# Patient Record
Sex: Male | Born: 1955
Health system: Southern US, Community
[De-identification: ages and names within clinical notes are randomized; demographics above are authoritative.]

## PROBLEM LIST (undated history)

## (undated) DIAGNOSIS — G4730 Sleep apnea, unspecified: Secondary | ICD-10-CM

## (undated) DIAGNOSIS — M4800 Spinal stenosis, site unspecified: Secondary | ICD-10-CM

## (undated) DIAGNOSIS — R351 Nocturia: Secondary | ICD-10-CM

## (undated) DIAGNOSIS — D472 Monoclonal gammopathy: Secondary | ICD-10-CM

## (undated) HISTORY — DX: Spinal stenosis, site unspecified: M48.00

## (undated) HISTORY — PX: BASAL CELL CARCINOMA EXCISION: SHX1214

## (undated) HISTORY — PX: OTHER SURGICAL HISTORY: SHX169

---

## 1998-06-29 ENCOUNTER — Ambulatory Visit (HOSPITAL_BASED_OUTPATIENT_CLINIC_OR_DEPARTMENT_OTHER): Admission: RE | Admit: 1998-06-29 | Discharge: 1998-06-29 | Payer: Self-pay | Admitting: Orthopedic Surgery

## 2000-09-15 ENCOUNTER — Ambulatory Visit (HOSPITAL_COMMUNITY): Admission: RE | Admit: 2000-09-15 | Discharge: 2000-09-15 | Payer: Self-pay | Admitting: Gastroenterology

## 2008-03-29 ENCOUNTER — Ambulatory Visit: Payer: Self-pay | Admitting: Sports Medicine

## 2008-03-29 DIAGNOSIS — M542 Cervicalgia: Secondary | ICD-10-CM | POA: Insufficient documentation

## 2009-09-07 ENCOUNTER — Ambulatory Visit: Payer: Self-pay | Admitting: Sports Medicine

## 2009-09-07 DIAGNOSIS — M25519 Pain in unspecified shoulder: Secondary | ICD-10-CM | POA: Insufficient documentation

## 2009-09-07 DIAGNOSIS — M67919 Unspecified disorder of synovium and tendon, unspecified shoulder: Secondary | ICD-10-CM | POA: Insufficient documentation

## 2009-09-07 DIAGNOSIS — M719 Bursopathy, unspecified: Secondary | ICD-10-CM

## 2010-05-08 NOTE — Assessment & Plan Note (Signed)
Summary: 4:15-SHOULDER PAIN,MC   Vital Signs:  Patient profile:   55 year old male Height:      76 inches Weight:      200 pounds BMI:     24.43 BP sitting:   112 / 71  Vitals Entered By: Lillia Pauls CMA (September 07, 2009 4:30 PM)  History of Present Illness: played tennis 4 x wk during winter served a lot past 2 mos some difference in strength RT shoulder pain over post shoulder at times p playing hurts to sleep on it but does not awaken at nite  no other pain with ADLs  RT arm is tight wi movement but not painful    Physical Exam  General:  Well-developed,well-nourished,in no acute distress; alert,appropriate and cooperative throughout examination Msk:  Inspection reveals no abnormalities or assymetry; no atrophy noted; palpation is unremarkable;  ROM is full in all planes. specific strength testing of Rotator cuff mm reveals good strength throughout; no signs of impingement; speeds and yergason's tests normal;  no labral pathology noted; norm scapular function observed.  negative painful arc and no drop arm sign.  minimal difference with slt weakness on empty can on RT and on pushoff on RT but not more than mild  Additional Exam:  MSK [place] RT shoulder scan completed norm AC norm BT Supraspinatus is wnl as is subscap and no impingement on movement of either Infraspinatus shows mild increase in fluid in subdeltoid bursa but MM appears nl teres minor nl  images saved   Impression & Recommendations:  Problem # 1:  SHOULDER PAIN, RIGHT (ICD-719.41)  His updated medication list for this problem includes:    Tramadol Hcl 50 Mg Tabs (Tramadol hcl) .Marland Kitchen... 1 by mouth q6h   can use meds only as needed  would start icing for this p playing  Orders: [place] LIMITED ([id number])  Problem # 2:  BURSITIS, SUBDELTOID (ICD-726.10)  given seres of exercises and theraband to use  would do consistent warmup  scan shows no tear so I think he can cont playing while doing rehab  reck  if not resolving in 6 wks  Orders: [place] LIMITED ([id number])  Complete Medication List: 1)  Tramadol Hcl 50 Mg Tabs (Tramadol hcl) .Marland Kitchen.. 1 by mouth q6h

## 2013-12-09 ENCOUNTER — Ambulatory Visit (INDEPENDENT_AMBULATORY_CARE_PROVIDER_SITE_OTHER): Payer: PRIVATE HEALTH INSURANCE | Admitting: Sports Medicine

## 2013-12-09 VITALS — BP 108/70 | Ht 76.0 in | Wt 195.0 lb

## 2013-12-09 DIAGNOSIS — M79609 Pain in unspecified limb: Secondary | ICD-10-CM

## 2013-12-09 DIAGNOSIS — M79644 Pain in right finger(s): Secondary | ICD-10-CM | POA: Insufficient documentation

## 2013-12-09 NOTE — Progress Notes (Signed)
Patient ID: Ryan Fox, male   DOB: 1955/07/27, 58 y.o.   MRN: [id number]  Patient with 1 mo of RT thumb pain at MCP No specific injury sometims sharp and shooting down the phalanx No swelling Became very painful during tennis match last night Usually improves after first several games  Exam NAD BP 108/70  Ht  (1.93 m)  Wt 195 lb (88.451 kg)  BMI 23.75 kg/m2  No swelling or deformity at MCP 1 or CMC on RT Full ROM of thumb No pain over resisted motions No redness Neg finklestein  [place] of thumb and MCP Periarticular cyst on dorsum of MCP 1 RT but not on left Small calcification in medial joint line No effusion Flexor hallucis is normal EPB and ABB are normal CMPTs 1 to 6 are normal sessamoid bone at base of thumb

## 2013-12-09 NOTE — Assessment & Plan Note (Signed)
Given COBAN Try taping the base of thumb for next 3 to 4 weeks See if pain resolves

## 2014-01-27 ENCOUNTER — Ambulatory Visit (INDEPENDENT_AMBULATORY_CARE_PROVIDER_SITE_OTHER): Payer: PRIVATE HEALTH INSURANCE | Admitting: Sports Medicine

## 2014-01-27 ENCOUNTER — Encounter: Payer: Self-pay | Admitting: Sports Medicine

## 2014-01-27 VITALS — BP 108/71

## 2014-01-27 DIAGNOSIS — M25511 Pain in right shoulder: Secondary | ICD-10-CM

## 2014-01-27 DIAGNOSIS — M12811 Other specific arthropathies, not elsewhere classified, right shoulder: Secondary | ICD-10-CM

## 2014-01-27 DIAGNOSIS — M75101 Unspecified rotator cuff tear or rupture of right shoulder, not specified as traumatic: Secondary | ICD-10-CM

## 2014-01-27 MED ORDER — NITROGLYCERIN 0.2 MG/HR TD PT24: 0.2000 mg | MEDICATED_PATCH | Freq: Every day | TRANSDERMAL | Status: DC

## 2014-01-27 NOTE — Assessment & Plan Note (Signed)
Full thickness tear of anterior belly of supraspinatus and partial tear of pec major at insertion into the BT occurs.  - Nitro 1/4" at point of maximal tenderness. Change daily  - given home exercises and stretches  - can continue to play tennis as long as the shots don't aggravate his pain - f/u in one month to ultrasound tear again

## 2014-01-27 NOTE — Progress Notes (Signed)
  Ryan Fox - 58 y.o. male MRN [id number]  Date of birth: 06/24/1955  SUBJECTIVE:     [name] is a 58 yo M presenting with right shoulder pain.   He was playing tennis about 6 weeks ago and noticed a discomfort in his shoulder while serving. He was able to play the rest of the match but the shoulder pain more noticeable. The pain was severe after he got done playing for the match. He quit playing tennis for 4 weeks and pain improved. He tried playing about two weeks ago and noticed the pain when he tried hitting his serve harder. He denies any popping or tearing motion in his shoulder when the pain first occurred. Denies on numbness, tingling and weakness. There is no pain with sleeping on his shoulder. There is no nighttime wakening's. He denies any prior surgery or injury to that shoulder.    ROS:     See HPI   OBJECTIVE: BP 108/71  Physical Exam:  Vital signs are reviewed. General: NAD, well appearing, alert Shoulder: Laterality: right Appearance: symmetric, no erythema, ecchymosis  Tenderness: no   Range of Motion: Passive abduction: normal Flexion:normal IR: normal ER: normal  Active abduction: normal Flexion: normal IR: normal ER: normal  Maneuvers: Empty can: neg Internal rotation: neg External rotation:neg Hawkin's test: neg O'Brien's test: neg Speeds: neg Yergason's: neg Resisted supination mimicked some pain Normal scapular function observed. No painful arc and no drop arm sign. Strength:  Bicep: 5/5 Grip: 5/5  MSK US right shoulder: BT showing a partial tear where insertion of the pec major.  Some hypoechoic changes around the BT suggesting fluid. The anterior belly of supraspinatus with a full thickness tear measuring 1.26 cm. Posterior belly of supraspinatus intact. Subscapularis, infraspinatus and teres minor are all intact. AC joint showing no effusion.   ASSESSMENT & PLAN:  See problem based charting & AVS for pt instructions.

## 2014-02-01 DIAGNOSIS — M12811 Other specific arthropathies, not elsewhere classified, right shoulder: Secondary | ICD-10-CM | POA: Insufficient documentation

## 2014-02-01 DIAGNOSIS — M75101 Unspecified rotator cuff tear or rupture of right shoulder, not specified as traumatic: Secondary | ICD-10-CM | POA: Insufficient documentation

## 2014-02-01 NOTE — Assessment & Plan Note (Signed)
Unfortunately patient developed HA within 2 days of NTG that were too severe to function  Use Aleve 2 bid x 2 weeks  Start some arnica gel for topical relief

## 2014-03-15 ENCOUNTER — Ambulatory Visit (INDEPENDENT_AMBULATORY_CARE_PROVIDER_SITE_OTHER): Payer: PRIVATE HEALTH INSURANCE | Admitting: Sports Medicine

## 2014-03-15 ENCOUNTER — Encounter: Payer: Self-pay | Admitting: Sports Medicine

## 2014-03-15 VITALS — BP 105/71 | Ht 76.0 in | Wt 195.0 lb

## 2014-03-15 DIAGNOSIS — M75101 Unspecified rotator cuff tear or rupture of right shoulder, not specified as traumatic: Secondary | ICD-10-CM

## 2014-03-15 DIAGNOSIS — M12811 Other specific arthropathies, not elsewhere classified, right shoulder: Secondary | ICD-10-CM

## 2014-03-15 NOTE — Progress Notes (Signed)
  Ryan ReaperJames Fox - 58 y.o. male MRN [id number]  Date of birth: [birth year]  SUBJECTIVE:     [name] is a 58 yo M presenting with right shoulder pain.   The patient's previous office visit he was diagnosed with rotator cuff tendinitis particularly with a small partial tear to the supraspinatus seen on ultrasound. Patient has been treating his shoulder pain with nitroglycerin, home exercise program, and some relative rest with less aggressive hits and tenderness. Patient did not tolerate the nitroglycerin after the first week and discontinued this medication. However despite this change in treatment patient has received some clinical improvement from the home exercise program as well as decreasing aggressive tennis playing and frequency over the last 6 weeks. Patient reports he is clinically improved with no longer having pain. He reports normal range of motion and normal strength with no problems during tennis activity.  ROS:     See HPI, review of systems otherwise negative  OBJECTIVE: BP 105/71 mmHg  Ht 6\' 4"  (1.93 m)  Wt 195 lb (88.451 kg)  BMI 23.75 kg/m2  Physical Exam:  Vital signs are reviewed. General: NAD, well appearing, alert Shoulder: Laterality: right Appearance: symmetric, no erythema, ecchymosis  Tenderness: no   Range of motion between 0 and 180 of forward flexion and abduction. Symmetric back scratch at approximately T7 Range of Motion: Passive abduction: normal Flexion:normal IR: normal ER: normal  Active abduction: normal Flexion: normal IR: normal ER: normal  Maneuvers: Empty can: neg Internal rotation: neg External rotation:neg Hawkin's test: neg O'Brien's test: neg Speeds: neg Yergason's: neg Resisted supination mimicked some pain Normal scapular function observed. No painful arc and no drop arm sign. Strength:   Forward flexion 5/5  Scapular flexion 5/5  Internal and external rotation 5/5 Bicep: 5/5 Grip: 5/5  MSK US right shoulder:Repeat muster skeletal  ultrasound revealed resolution of partial tear to the biceps tendon insertion. Mild hypoechoic changes of around the bicipital groove. Gradual healing and improvement of the tear to the supraspinatus appears to be more partial-thickness and full-thickness. Posterior part of the supraspinatus intact. Subscapularis infraspinatus and teres minor are all intact. Normal AC joint.  ASSESSMENT & PLAN: See problem based charting & AVS for pt instructions.

## 2014-03-15 NOTE — Assessment & Plan Note (Signed)
Patient's history, exam, clinical improvement are consistent with resolution of patient's rotator cuff tendinopathy seen on ultrasound and on previous exam. Patient had a good clinical response from therapeutic exercise.  Recommendations: Advised patient to continue home exercise activities at least 3 days a week to maintain strength Gradually return to more aggressive tennis activities avoiding overhead aggressive hits Void pushups and do an alternative triceps push up to protect rotator cuff muscles Follow-up in 6-8 weeks to re-ultrasound shoulder confirm continued healing

## 2015-01-06 ENCOUNTER — Ambulatory Visit (INDEPENDENT_AMBULATORY_CARE_PROVIDER_SITE_OTHER): Payer: PRIVATE HEALTH INSURANCE | Admitting: Sports Medicine

## 2015-01-06 ENCOUNTER — Encounter: Payer: Self-pay | Admitting: Sports Medicine

## 2015-01-06 VITALS — BP 112/70 | Ht 76.0 in | Wt 195.0 lb

## 2015-01-06 DIAGNOSIS — M7701 Medial epicondylitis, right elbow: Secondary | ICD-10-CM | POA: Diagnosis not present

## 2015-01-06 MED ORDER — MELOXICAM 15 MG PO TABS: ORAL_TABLET | ORAL | Status: DC

## 2015-01-06 NOTE — Progress Notes (Signed)
   Subjective:    Patient ID: Ryan Fox, male    DOB: 1955/04/24, 59 y.o.   MRN: [id number]  HPI chief complaint: Right elbow pain  59 year old right-hand-dominant male comes in today complaining of 2 months of medial sided right elbow pain. No trauma that he can recall but a gradual onset of pain that he first noticed while doing pushups. He has since discontinued doing pushups but continues to experience intermittent "stinging" pain along his medial epicondyle particularly with any sort of activity that requires him to push away from his body. He has not noticed any swelling. He denies numbness or tingling. No prior occurrences. Pain does not awaken him at night. 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 No pain with golf either. He has been experiencing some right-sided neck pain which she describes as a "crick and my neck" which began 4 days ago but he feels like it is unrelated to his elbow pain.  Interim medical history reviewed Medications reviewed Allergies reviewed    Review of Systems    as above Objective:   Physical Exam Well-developed, well-nourished. No acute distress. Awake alert and oriented 3. Vital signs reviewed.  Right elbow: Full range of motion. No effusion. No soft tissue swelling. There is slight tenderness to palpation at the medial epicondyle with reproducible pain with resisted wrist flexion and ulnar deviation. No ecchymosis. Good stability. No tenderness over the lateral epicondyle. Negative Tinel's over the to cubital tunnel. Good grip strength. Skin is intact. Good radial and ulnar pulses.  Left elbow: Full range of motion. Good strength. Good stability. No tenderness to palpation. Skin is intact. Neurovascularly intact distally.  MSK ultrasound of the right elbow was performed. Limited images of the medial elbow were obtained. Common flexor tendon is well visualized and does not appear to have any specific tear but there is  an area of hypoechoic change deep to the common flexor tendon which may represent some inflammation in this area.       Assessment & Plan:  Right elbow pain secondary to medial epicondylitis  I will place the patient on meloxicam 15 mg daily for 7 days then as needed. Hopefully this will also help the pain he is getting in his neck. I do not believe that his neck pain and his elbow pain are related. I've given him a home exercise program for medial epicondylitis and instructed him to call me in 3-4 weeks if he is still experiencing pain. If that is the case then I would consider formal physical therapy. I think he can continue with activity using pain as his guide and avoiding those activities that he knows will cause him pain. Of note, he has tried topical nitroglycerin in the past for a shoulder injury but did not tolerate it. Follow-up as needed.

## 2016-06-04 ENCOUNTER — Encounter: Payer: Self-pay | Admitting: Sports Medicine

## 2016-06-04 ENCOUNTER — Ambulatory Visit (INDEPENDENT_AMBULATORY_CARE_PROVIDER_SITE_OTHER): Payer: 59 | Admitting: Sports Medicine

## 2016-06-04 ENCOUNTER — Ambulatory Visit
Admission: RE | Admit: 2016-06-04 | Discharge: 2016-06-04 | Disposition: A | Discharge status: Home / Self Care | Payer: 59 | Source: Ambulatory Visit | Attending: Sports Medicine | Admitting: Sports Medicine

## 2016-06-04 VITALS — BP 95/65 | HR 72 | Ht 75.0 in | Wt 200.0 lb

## 2016-06-04 DIAGNOSIS — M545 Low back pain, unspecified: Secondary | ICD-10-CM

## 2016-06-04 DIAGNOSIS — M48061 Spinal stenosis, lumbar region without neurogenic claudication: Secondary | ICD-10-CM | POA: Insufficient documentation

## 2016-06-04 HISTORY — DX: Spinal stenosis, lumbar region without neurogenic claudication: M48.061

## 2016-06-04 MED ORDER — CYCLOBENZAPRINE HCL 10 MG PO TABS: 10.0000 mg | ORAL_TABLET | Freq: Every evening | ORAL | 1 refills | Status: DC | PRN

## 2016-06-04 NOTE — Assessment & Plan Note (Signed)
In the absence of any abnormalities on x-ray I would recommend that we continue conservative care He needs to do postural exercise and pelvic tilt to try to improve the alignment of the lumbar spine  Considering he has had problems off and on for years he may want to try yoga or Pilates  Flexeril 10 mg at night for muscle relaxant Flexion exercises Ice or heat as needed

## 2016-06-04 NOTE — Progress Notes (Signed)
Low back pain  Mid 1980s LBP to SI joint/ playing VB Took a year to resolve  Periodically has LBP that resolves in 1 to 2 days  Walking and standing worse  5 days ago on airplane He felt sharp pain in his back Difficult to bend over He has rested since then and the pain has improved somewhat Ibuprofen helps  Social history Works as an Associate Professorattorney Nonsmoker  Review of systems No sciatic No bowel or bladder symptoms No weakness in his lower extremities  Physical exam Thin white male in no acute distress BP 95/65   Pulse 72   Ht 6\' 3"  (1.905 m)   Wt 200 lb (90.7 kg)   BMI 25.00 kg/m   Inspection of the lower back reveals increased lordosis No palpable area of tenderness Forward flexion causes pain before 90 Extension causes pain at 25-30 Lateral bending is pain free Rotation is pain free  Heel, toe and tandem walk are normal Reflexes are normal Strength testing of both lower extremities is normal  XRay - reviewed by me and I do not see any acute injury.  There is increased Lordosis particularly at L5/S1

## 2016-06-05 ENCOUNTER — Other Ambulatory Visit: Payer: Self-pay | Admitting: *Deleted

## 2016-06-05 DIAGNOSIS — M545 Low back pain, unspecified: Secondary | ICD-10-CM

## 2017-12-22 ENCOUNTER — Ambulatory Visit (INDEPENDENT_AMBULATORY_CARE_PROVIDER_SITE_OTHER): Payer: 59 | Admitting: Sports Medicine

## 2017-12-22 VITALS — BP 120/78 | Ht 75.5 in | Wt 198.0 lb

## 2017-12-22 DIAGNOSIS — R200 Anesthesia of skin: Secondary | ICD-10-CM | POA: Diagnosis not present

## 2017-12-23 ENCOUNTER — Encounter: Payer: Self-pay | Admitting: Sports Medicine

## 2017-12-23 NOTE — Progress Notes (Signed)
   Subjective:    Patient ID: Ryan ReaperJames Duer, male    DOB: 30-Aug-1955, 62 y.o.   MRN: [id number]  HPI chief complaint: Bilateral foot numbness  Very pleasant 62 year old male comes in today complaining of 4 months of numbness and tingling in all 10 of his toes.  His symptoms do not seem to be related to any specific activity.  He denies numbness or tingling elsewhere in the foot or ankle but does endorse a feeling of "puffiness" on the plantar aspect of both feet, specifically from the midfoot down into the toes.  He denies any back pain currently but does have a history of low back pain which was evaluated by Dr. Darrick PennaFields.  An x-ray of his lumbar spine done previously showed an exaggerated lordosis which was felt to be responsible for his pain.  He improved with a home exercise program and was doing well up until this numbness in his toes began 4 months ago.  He has not noticed anything specifically that makes his symptoms worse but he did buy a pair of well cushioned supportive sandals which do seem to be helping somewhat.  Patient is somewhat concerned about the possibility of lumbar stenosis being responsible for his toe numbness.  Past medical history reviewed Medications reviewed Allergies reviewed    Review of Systems    As above Objective:   Physical Exam  Well-developed, well-nourished.  No acute distress.  Awake alert and oriented x3.  Vital signs reviewed.  Sitting comfortably in the exam room  Examination of both feet in the standing position shows transverse arch collapse bilaterally.  Fairly well preserved longitudinal arch.  No abnormal callus formation.  No soft tissue swelling.  Good pulses.  Sensation appears to be intact to light touch grossly.  Negative Tinel's over the tarsal tunnel.  Good strength.  Walking without a limp.      Assessment & Plan:   Bilateral toe numbness and tingling  Symptoms are definitely neuropathic.  It would be an unusual presentation of spinal  stenosis.  I did discuss the possibility of an MRI of his lumbar spine to evaluate further but we decided to wait on that for now.  He may be getting some peripheral nerve irritation in his foot given the improvement with his recent sandals with arch support.  Patient would like to try a green sports insole with a scaphoid pad in his other shoes to see if this helps alleviate his symptoms.  He does not really have any significant pain so I do not think Neurontin is necessary.  I would like to reevaluate him in 4 weeks.  If symptoms persist then we could reconsider merits of lumbar spine MR.  If his symptoms worsen then I would consider merits of EMG/nerve conduction study.  He may continue with all activity including tennis without restriction and is encouraged to call me with questions or concerns prior to our follow-up visit.

## 2018-01-19 ENCOUNTER — Ambulatory Visit (INDEPENDENT_AMBULATORY_CARE_PROVIDER_SITE_OTHER): Payer: 59 | Admitting: Sports Medicine

## 2018-01-19 VITALS — BP 98/70 | Ht 75.0 in | Wt 195.0 lb

## 2018-01-19 DIAGNOSIS — R200 Anesthesia of skin: Secondary | ICD-10-CM | POA: Diagnosis not present

## 2018-01-20 NOTE — Progress Notes (Signed)
  Ryan Fox comes in today for follow-up.  Unfortunately he is still experiencing numbness in his toes but he likes the arch support in his dress shoes.  He would like additional scaphoid pads for other shoes.  Physical exam was not repeated today.  We simply talked about his ongoing bilateral toe numbness.  He does not endorse any weakness or significant pain.  Although it is possible that his symptoms are originating from lumbar spinal stenosis we have decided to wait on further diagnostic imaging for the time being.  Ryan Fox does understand that if his symptoms worsen or he begins to develop weakness or pain then I would start with getting an MRI of his lumbar spine to rule out spinal stenosis.  If that study were to be unremarkable then I would consider referral to neurology.  In the meantime, we provided him with five additional pairs of scaphoid pads for his dress shoes.  Total time of 15 minutes was spent with the patient with greater than 50% of the time spent in face-to-face consultation discussing the possibility of an MRI of his lumbar spine and fitting his shoes with his new scaphoid pads.  He will follow-up with me as needed.

## 2018-02-16 DIAGNOSIS — Z0000 Encounter for general adult medical examination without abnormal findings: Secondary | ICD-10-CM | POA: Diagnosis not present

## 2018-02-16 DIAGNOSIS — R82998 Other abnormal findings in urine: Secondary | ICD-10-CM | POA: Diagnosis not present

## 2018-02-16 DIAGNOSIS — R7309 Other abnormal glucose: Secondary | ICD-10-CM | POA: Diagnosis not present

## 2018-02-23 DIAGNOSIS — Z23 Encounter for immunization: Secondary | ICD-10-CM | POA: Diagnosis not present

## 2018-02-23 DIAGNOSIS — Z6824 Body mass index (BMI) 24.0-24.9, adult: Secondary | ICD-10-CM | POA: Diagnosis not present

## 2018-02-23 DIAGNOSIS — Z0000 Encounter for general adult medical examination without abnormal findings: Secondary | ICD-10-CM | POA: Diagnosis not present

## 2018-02-23 DIAGNOSIS — F418 Other specified anxiety disorders: Secondary | ICD-10-CM | POA: Diagnosis not present

## 2018-02-23 DIAGNOSIS — J3089 Other allergic rhinitis: Secondary | ICD-10-CM | POA: Diagnosis not present

## 2018-02-23 DIAGNOSIS — R7309 Other abnormal glucose: Secondary | ICD-10-CM | POA: Diagnosis not present

## 2018-02-23 DIAGNOSIS — M79673 Pain in unspecified foot: Secondary | ICD-10-CM | POA: Diagnosis not present

## 2018-02-24 DIAGNOSIS — Z1212 Encounter for screening for malignant neoplasm of rectum: Secondary | ICD-10-CM | POA: Diagnosis not present

## 2018-02-27 DIAGNOSIS — L570 Actinic keratosis: Secondary | ICD-10-CM | POA: Diagnosis not present

## 2018-02-27 DIAGNOSIS — X32XXXD Exposure to sunlight, subsequent encounter: Secondary | ICD-10-CM | POA: Diagnosis not present

## 2018-05-04 DIAGNOSIS — Z6824 Body mass index (BMI) 24.0-24.9, adult: Secondary | ICD-10-CM | POA: Diagnosis not present

## 2018-05-04 DIAGNOSIS — J40 Bronchitis, not specified as acute or chronic: Secondary | ICD-10-CM | POA: Diagnosis not present

## 2018-05-04 DIAGNOSIS — R05 Cough: Secondary | ICD-10-CM | POA: Diagnosis not present

## 2018-05-12 DIAGNOSIS — J40 Bronchitis, not specified as acute or chronic: Secondary | ICD-10-CM | POA: Diagnosis not present

## 2018-05-12 DIAGNOSIS — Z6824 Body mass index (BMI) 24.0-24.9, adult: Secondary | ICD-10-CM | POA: Diagnosis not present

## 2018-10-16 DIAGNOSIS — Z1283 Encounter for screening for malignant neoplasm of skin: Secondary | ICD-10-CM | POA: Diagnosis not present

## 2018-10-16 DIAGNOSIS — L570 Actinic keratosis: Secondary | ICD-10-CM | POA: Diagnosis not present

## 2018-10-16 DIAGNOSIS — L821 Other seborrheic keratosis: Secondary | ICD-10-CM | POA: Diagnosis not present

## 2018-10-16 DIAGNOSIS — X32XXXD Exposure to sunlight, subsequent encounter: Secondary | ICD-10-CM | POA: Diagnosis not present

## 2018-10-16 DIAGNOSIS — D225 Melanocytic nevi of trunk: Secondary | ICD-10-CM | POA: Diagnosis not present

## 2019-02-18 ENCOUNTER — Ambulatory Visit
Admission: RE | Admit: 2019-02-18 | Discharge: 2019-02-18 | Disposition: A | Discharge status: Home / Self Care | Payer: BC Managed Care – PPO | Source: Ambulatory Visit | Attending: Sports Medicine | Admitting: Sports Medicine

## 2019-02-18 ENCOUNTER — Other Ambulatory Visit: Payer: Self-pay

## 2019-02-18 ENCOUNTER — Ambulatory Visit: Payer: BC Managed Care – PPO | Admitting: Sports Medicine

## 2019-02-18 VITALS — BP 110/78 | Ht 75.0 in | Wt 200.0 lb

## 2019-02-18 DIAGNOSIS — M5416 Radiculopathy, lumbar region: Secondary | ICD-10-CM

## 2019-02-18 DIAGNOSIS — R200 Anesthesia of skin: Secondary | ICD-10-CM

## 2019-02-18 DIAGNOSIS — G8929 Other chronic pain: Secondary | ICD-10-CM | POA: Diagnosis not present

## 2019-02-18 DIAGNOSIS — M545 Low back pain: Secondary | ICD-10-CM | POA: Diagnosis not present

## 2019-02-18 NOTE — Assessment & Plan Note (Signed)
Chronic LBP with no specific DX  Repeat XR to rule out any spondy or change in bone/ disc space  Check MRI to see if any reason for distal neuropathy that is bilateral  Cont exercise series

## 2019-02-18 NOTE — Assessment & Plan Note (Signed)
With hsx of LBP will check MRI first  May need more workup to include NCV

## 2019-02-18 NOTE — Progress Notes (Signed)
Patient with CC: LBP and foot numbness  Patient has had several bouts of rather severe LBP XR in 2018 was relatively benign Went to PT with Katrine Coho and this has really helped decrease the flares However, since 2019 has had some progressive numbness in toes of both feet Seen by Dr Micheline Chapman and nothing remarkable on exam Trial of sports insoles with arch support helped but numbness persisted  Describes as funny sensation in toes Shoes start to feel tight Flexing toes may lessen the sxs Occurs at night Can wake him from sleep Tennis does not really make it worse during play but may have more feelings of tightness later  Past Hx NO DM, Thryoid or neurological diseases No radiation exposure or toxic medications No known chemical exposures  ROS Back pain is triggered sometimes by wrong movement - too much flexion or extension No sciatic sxs No weakness in LEs  PE Athletic M in NAD BP 110/78   Ht 6\' 3"  (1.905 m)   Wt 200 lb (90.7 kg)   BMI 25.00 kg/m   Back flexion and extension normal - he is cautious with flexion Lateral bend normal Rotation feels fine with no pain and good motion Heel and toe walk normal  Strength norm SLR neg DTRs 2+ knee and ankle sensory change is mild over toes and can feel light touch Pulses normal  XR reviewed There is some loss of disc space upper lumbar versus films of 2018 No other changes to explain numbness in toes

## 2019-02-19 DIAGNOSIS — R739 Hyperglycemia, unspecified: Secondary | ICD-10-CM | POA: Diagnosis not present

## 2019-02-19 DIAGNOSIS — Z125 Encounter for screening for malignant neoplasm of prostate: Secondary | ICD-10-CM | POA: Diagnosis not present

## 2019-02-19 DIAGNOSIS — Z0000 Encounter for general adult medical examination without abnormal findings: Secondary | ICD-10-CM | POA: Diagnosis not present

## 2019-02-19 DIAGNOSIS — R82998 Other abnormal findings in urine: Secondary | ICD-10-CM | POA: Diagnosis not present

## 2019-02-19 DIAGNOSIS — Z79899 Other long term (current) drug therapy: Secondary | ICD-10-CM | POA: Diagnosis not present

## 2019-02-26 ENCOUNTER — Other Ambulatory Visit: Payer: Self-pay

## 2019-02-26 ENCOUNTER — Ambulatory Visit
Admission: RE | Admit: 2019-02-26 | Discharge: 2019-02-26 | Disposition: A | Discharge status: Home / Self Care | Payer: BC Managed Care – PPO | Source: Ambulatory Visit | Attending: Sports Medicine | Admitting: Sports Medicine

## 2019-02-26 DIAGNOSIS — R209 Unspecified disturbances of skin sensation: Secondary | ICD-10-CM | POA: Diagnosis not present

## 2019-02-26 DIAGNOSIS — R739 Hyperglycemia, unspecified: Secondary | ICD-10-CM | POA: Diagnosis not present

## 2019-02-26 DIAGNOSIS — M545 Low back pain: Secondary | ICD-10-CM | POA: Diagnosis not present

## 2019-02-26 DIAGNOSIS — Z1331 Encounter for screening for depression: Secondary | ICD-10-CM | POA: Diagnosis not present

## 2019-02-26 DIAGNOSIS — Z0000 Encounter for general adult medical examination without abnormal findings: Secondary | ICD-10-CM | POA: Diagnosis not present

## 2019-02-26 DIAGNOSIS — M5416 Radiculopathy, lumbar region: Secondary | ICD-10-CM

## 2019-02-26 DIAGNOSIS — J309 Allergic rhinitis, unspecified: Secondary | ICD-10-CM | POA: Diagnosis not present

## 2019-02-26 DIAGNOSIS — M48061 Spinal stenosis, lumbar region without neurogenic claudication: Secondary | ICD-10-CM | POA: Diagnosis not present

## 2019-03-03 ENCOUNTER — Other Ambulatory Visit: Payer: Self-pay

## 2019-03-03 MED ORDER — GABAPENTIN 300 MG PO CAPS: 300.0000 mg | ORAL_CAPSULE | Freq: Every day | ORAL | 2 refills | Status: DC

## 2019-03-24 DIAGNOSIS — Z1212 Encounter for screening for malignant neoplasm of rectum: Secondary | ICD-10-CM | POA: Diagnosis not present

## 2019-03-31 DIAGNOSIS — H5213 Myopia, bilateral: Secondary | ICD-10-CM | POA: Diagnosis not present

## 2019-03-31 DIAGNOSIS — H43811 Vitreous degeneration, right eye: Secondary | ICD-10-CM | POA: Diagnosis not present

## 2019-05-10 ENCOUNTER — Other Ambulatory Visit: Payer: Self-pay | Admitting: *Deleted

## 2019-05-10 MED ORDER — GABAPENTIN 300 MG PO CAPS: ORAL_CAPSULE | ORAL | 1 refills | Status: DC

## 2019-06-17 ENCOUNTER — Ambulatory Visit: Payer: Self-pay | Attending: Internal Medicine

## 2019-06-17 DIAGNOSIS — Z23 Encounter for immunization: Secondary | ICD-10-CM

## 2019-06-17 NOTE — Progress Notes (Signed)
   Covid-19 Vaccination Clinic  Name:  Praneel Haisley    MRN: [id number] DOB: 02-Aug-1955  06/17/2019  Mr. Kiner was observed post Covid-19 immunization for 15 minutes without incident. He was provided with Vaccine Information Sheet and instruction to access the V-Safe system.   Mr. Gunnels was instructed to call 911 with any severe reactions post vaccine: Marland Kitchen Difficulty breathing  . Swelling of face and throat  . A fast heartbeat  . A bad rash all over body  . Dizziness and weakness   Immunizations Administered    Name Date Dose VIS Date Route   Pfizer COVID-19 Vaccine 06/17/2019  4:07 PM 0.3 mL 03/19/2019 Intramuscular   Manufacturer: [employer], [employer]   Lot: [id number]   NDC: [address]-3

## 2019-07-12 ENCOUNTER — Ambulatory Visit: Payer: Self-pay | Attending: Internal Medicine

## 2019-07-12 DIAGNOSIS — Z23 Encounter for immunization: Secondary | ICD-10-CM

## 2019-07-12 NOTE — Progress Notes (Signed)
   Covid-19 Vaccination Clinic  Name:  Ryan Fox    MRN: [id number] DOB: 01/06/1956  07/12/2019  Mr. Comp was observed post Covid-19 immunization for 15 minutes without incident. He was provided with Vaccine Information Sheet and instruction to access the V-Safe system.   [name] was instructed to call 911 with any severe reactions post vaccine: Marland Kitchen Difficulty breathing  . Swelling of face and throat  . A fast heartbeat  . A bad rash all over body  . Dizziness and weakness   Immunizations Administered    Name Date Dose VIS Date Route   Pfizer COVID-19 Vaccine 07/12/2019 12:03 PM 0.3 mL 03/19/2019 Intramuscular   Manufacturer: [employer], [employer]   Lot: [id number]   NDC: [address]-7

## 2019-07-27 ENCOUNTER — Telehealth: Payer: Self-pay

## 2019-07-27 MED ORDER — GABAPENTIN 300 MG PO CAPS: ORAL_CAPSULE | ORAL | 1 refills | Status: DC

## 2019-07-27 NOTE — Telephone Encounter (Signed)
Refill sent to pharmacy for Gabapentin 900 mg at bedtime.

## 2019-09-29 ENCOUNTER — Other Ambulatory Visit: Payer: Self-pay

## 2019-09-29 MED ORDER — GABAPENTIN 300 MG PO CAPS: ORAL_CAPSULE | ORAL | 1 refills | Status: DC

## 2019-09-29 NOTE — Progress Notes (Signed)
Pt called asking for gabapentin refill.

## 2019-11-25 ENCOUNTER — Other Ambulatory Visit: Payer: Self-pay | Admitting: *Deleted

## 2019-11-25 MED ORDER — GABAPENTIN 300 MG PO CAPS: ORAL_CAPSULE | ORAL | 1 refills | Status: DC

## 2020-01-13 ENCOUNTER — Other Ambulatory Visit: Payer: Self-pay

## 2020-01-13 ENCOUNTER — Ambulatory Visit (INDEPENDENT_AMBULATORY_CARE_PROVIDER_SITE_OTHER): Payer: 59 | Admitting: Sports Medicine

## 2020-01-13 ENCOUNTER — Encounter: Payer: Self-pay | Admitting: Sports Medicine

## 2020-01-13 VITALS — BP 106/78 | Ht 75.0 in | Wt 200.0 lb

## 2020-01-13 DIAGNOSIS — R200 Anesthesia of skin: Secondary | ICD-10-CM

## 2020-01-13 DIAGNOSIS — M5416 Radiculopathy, lumbar region: Secondary | ICD-10-CM | POA: Diagnosis not present

## 2020-01-13 MED ORDER — GABAPENTIN 300 MG PO CAPS: ORAL_CAPSULE | ORAL | 2 refills | Status: DC

## 2020-01-13 NOTE — Patient Instructions (Signed)
Thank you for coming in to see Ryan Fox today! Please see below to review our plan for today's visit:   Please start the below gabapentin titration schedule:  Starting now: 300mg  in AM, 300mg  midday, 900mg  at bedtime In 1 week: 600mg  in AM, 600mg  midday, 900mg  at bedtime In 1 month: 900mg  in AM, 900mg  midday, 900mg  at bedtime  If it anytime you have relief of your symptoms, stop at that dose.  If you develop side effects you cannot tolerate, please stop and stop at that dose and return to your previous dose.  Please follow-up for orthotics, and in 3 months to reevaluate.  Please call the clinic at [phone] if your symptoms worsen or you have any concerns. It was our pleasure to serve you.       Dr. Dr. [facility]

## 2020-01-13 NOTE — Progress Notes (Signed)
PCP: Creola Corn, MD  Subjective:   HPI: Patient is a 64 y.o. male with history of bilateral plantar neuropathy and known spinal stenosis here for reevaluation of bilateral plantar foot numbness and tingling.  He was last seen here on 02/2019, at that time was having intermittent low back pain and numbness and tingling in his toes.  They were worse at night and can wake him from sleep.  It was thought that he may have a lumbar radiculopathy and MRI was obtained which showed mild spinal stenosis at L3-4, moderate spinal stenosis with moderate subarticular stenosis at L4-5 and mild subarticular stenosis at L5-S1.  He was managed conservatively with gabapentin 1200 mg at bedtime.  Since then, patient reports that his symptoms have progressively worsened.  He is now having significant numbness and tingling throughout both feet.  It does not feel painful, rather an aggravating numbness and tingling.  Is aggravated by anything that is making contact with the plantar aspect of his feet.  He has resorted to taking his shoes off while at work.  Gabapentin at night is helpful but does not get rid of the pain.  He is wondering what other options he has.  He denies any history of diabetes, thyroid disorders.  He reports a fairly normal American diet.  Review of Systems:  Per HPI.   PMFSH, medications and smoking status reviewed.      Objective:  Physical Exam:  [facility] 01/13/2020  Frequency of aerobic exercise (# of days/week) 3  Average time in minutes > 90  Frequency of strengthening activities (# of days/week) 3    BP 106/78   Ht 6\' 3"  (1.905 m)   Wt 200 lb (90.7 kg)   BMI 25.00 kg/m   Gen: awake, alert, NAD, comfortable in exam room Pulm: breathing unlabored  Lumbar spine:  Inspection: No evidence of erythema, ecchymosis, swelling edema.  Palpation: No midline spinal tenderness. No paraspinal tenderness of the lumbar spines. Nontender to SI joints.  ROM:  Intact to forward flexion, extension, rotation, and bending.  Special tests: Neg straight leg raise bilaterally  Bilateral feet:  Inspection:  No obvious bony deformity.  No swelling, erythema, or bruising.  Normal arch Palpation: Reproduction of symptoms with palpation of plantar foot. ROM: Full  ROM of the ankle. Normal midfoot flexibility Strength: 5/5 strength ankle in all planes, 5/5 strength with dorsiflexion of great toe. Neuro: Sensation testing with needle prick normal bilaterally on the plantar foot.  Mildly decreased reflexes with achilles reflex, normal patellar and hamstrings reflexes  Note some color change in feet    Assessment & Plan:  1.  Bilateral plantar neuropathy 2.  Lumbar radiculopathy secondary to spinal stenosis  Patient with continued/worsening plantar foot neuropathy.  Continue to suspect spinal stenosis as cause, however cannot rule out alternative such as B12 deficiency, folate deficiency, hypothyroidism, diabetic neuropathy, tarsal tunnel syndrome, among others.  He reports that he has gotten labs to screen for diabetes and hypothyroidism and does not have these.  He is not vegetarian so B12/folate deficiency unlikely.  We will plan to continue to treat as radiculopathy secondary spinal stenosis, increase his gabapentin to 3 times a day and titrate up per the protocol in his AVS.  We will also have him back for custom orthotics as well cushioned shoes are helpful for his symptoms.  If he continues have symptoms with this, would consider referral to neurology for further evaluation.   , MD [facility] 01/13/2020 4:42 PM   I observed and examined the patient with the [facility] resident and agree with assessment and plan.  Note reviewed and modified by me. Sterling Big, MD

## 2020-05-10 ENCOUNTER — Other Ambulatory Visit: Payer: Self-pay

## 2020-05-10 MED ORDER — GABAPENTIN 300 MG PO CAPS: ORAL_CAPSULE | ORAL | 2 refills | Status: DC

## 2020-05-10 NOTE — Progress Notes (Signed)
Patient called asking for a refill on gabapentin.

## 2020-08-22 ENCOUNTER — Other Ambulatory Visit: Payer: Self-pay | Admitting: Sports Medicine

## 2020-08-22 ENCOUNTER — Other Ambulatory Visit: Payer: Self-pay

## 2020-12-19 ENCOUNTER — Telehealth: Payer: Self-pay | Admitting: Sports Medicine

## 2020-12-19 ENCOUNTER — Other Ambulatory Visit: Payer: Self-pay

## 2020-12-19 MED ORDER — GABAPENTIN 300 MG PO CAPS: ORAL_CAPSULE | ORAL | 2 refills | Status: DC

## 2020-12-19 NOTE — Telephone Encounter (Signed)
Pt called to request refill of :  gabapentin (NEURONTIN) 300 MG capsule [[id number]]    Order Details Dose, Route, Frequency: As Directed  Dispense Quantity: 270 capsule Refills: 2        Sig: TAKE 3 CAPSULES(900 MG) BY MOUTH THREE TIMES DAILY   --Pt uses :  Pharmacy  [id number] [place], [place] - [address] AT [facility] [address] & [address] [place] [address]  Phone:  [phone]  Fax:  [phone]   --glh

## 2020-12-20 ENCOUNTER — Encounter (INDEPENDENT_AMBULATORY_CARE_PROVIDER_SITE_OTHER): Payer: Self-pay

## 2020-12-26 ENCOUNTER — Other Ambulatory Visit: Payer: Self-pay

## 2020-12-26 ENCOUNTER — Encounter (INDEPENDENT_AMBULATORY_CARE_PROVIDER_SITE_OTHER): Payer: Self-pay | Admitting: Ophthalmology

## 2020-12-26 ENCOUNTER — Ambulatory Visit (INDEPENDENT_AMBULATORY_CARE_PROVIDER_SITE_OTHER): Payer: 59 | Admitting: Ophthalmology

## 2020-12-26 DIAGNOSIS — H2513 Age-related nuclear cataract, bilateral: Secondary | ICD-10-CM | POA: Insufficient documentation

## 2020-12-26 DIAGNOSIS — H35371 Puckering of macula, right eye: Secondary | ICD-10-CM | POA: Diagnosis not present

## 2020-12-26 NOTE — Assessment & Plan Note (Signed)
The nature of macular pucker (epiretinal membrane ERM) was discussed with the patient as well as threshold criteria for vitrectomy surgery. I explained that in rare cases another surgery is needed to actually remove a second wrinkle should it regrow.  Most often, the epiretinal membrane and underlying wrinkled internal limiting membrane are removed with the first surgery, to accomplish the goals.   If the operative eye is Phakic (natural lens still present), cataract surgery is often recommended prior to Vitrectomy. This will enable the retina surgeon to have the best view during surgery and the patient to obtain optimal results in the future. Treatment options were discussed.  I have recommended at home monitoring the near vision task in a monocular (1 eye at a time), with or without near vision glasses, to look for changes or declines in reading.   

## 2020-12-26 NOTE — Progress Notes (Signed)
12/26/2020     CHIEF COMPLAINT Patient presents for  Chief Complaint  Patient presents with   Retina Evaluation      HISTORY OF PRESENT ILLNESS: Ryan Fox is a 65 y.o. male who presents to the clinic today for:   HPI     Retina Evaluation   In right eye.  This started 2 months ago.  Duration of 2 months.  Associated Symptoms Distortion.        Comments   New Patient retinal evaluation for possible AMD with CME referred by Dr. Delman Cheadle  Pt c/o mild visual distortion in the right eye, started ~ 1-2 months ago. Pt describes as seeing a bend or a curve to lines that should appear straight. Pt c/o having floaters in both eyes for the past several years. Pt denies any flashes of light. Pt denies any eye pain.   Eye Meds: None      Last edited by Reather Littler, COA on 12/26/2020  8:49 AM.      Referring physician: Sharyne Peach, MD [address] [place],  [place] [address]  HISTORICAL INFORMATION:   Selected notes from the MEDICAL RECORD NUMBER       CURRENT MEDICATIONS: No current outpatient medications on file. (Ophthalmic Drugs)   No current facility-administered medications for this visit. (Ophthalmic Drugs)   Current Outpatient Medications (Other)  Medication Sig   gabapentin (NEURONTIN) 300 MG capsule TAKE 3 CAPSULES(900 MG) BY MOUTH THREE TIMES DAILY   No current facility-administered medications for this visit. (Other)      REVIEW OF SYSTEMS:    ALLERGIES No Known Allergies  PAST MEDICAL HISTORY Past Medical History:  Diagnosis Date   Spinal stenosis    History reviewed. No pertinent surgical history.  FAMILY HISTORY Family History  Problem Relation Age of Onset   Glaucoma Mother    Macular degeneration Mother    Glaucoma Maternal Grandfather     SOCIAL HISTORY Social History   Tobacco Use   Smoking status: Never   Smokeless tobacco: Never         OPHTHALMIC EXAM:  Base Eye Exam     Visual Acuity (ETDRS)       Right  Left   Dist cc 20/30 +1 20/20 -1   Dist ph cc NI     Correction: Glasses         Tonometry (Tonopen, 8:48 AM)       Right Left   Pressure 20 21         Pupils       Pupils Dark Light Shape React APD   Right PERRL 4 3 Round Brisk None   Left PERRL 4 3 Round Brisk None         Visual Fields (Counting fingers)       Left Right    Full Full         Extraocular Movement       Right Left    Full, Ortho Full, Ortho         Neuro/Psych     Oriented x3: Yes   Mood/Affect: Normal         Dilation     Both eyes: 1.0% Mydriacyl, 2.5% Phenylephrine @ 8:48 AM           Slit Lamp and Fundus Exam     External Exam       Right Left   External Normal Normal  Slit Lamp Exam       Right Left   Lids/Lashes Normal Normal   Conjunctiva/Sclera White and quiet White and quiet   Cornea Clear Clear   Anterior Chamber Deep and quiet Deep and quiet   Iris Round and reactive Round and reactive   Lens 2+ Nuclear sclerosis 1.5+ Nuclear sclerosis   Anterior Vitreous Normal Normal         Fundus Exam       Right Left   Posterior Vitreous Normal Normal   Disc Normal Normal   C/D Ratio 0.1 0.15   Macula Epiretinal membrane, moderate to severe topographic distortion Normal   Vessels Normal Normal   Periphery Normal Normal            IMAGING AND PROCEDURES  Imaging and Procedures for 12/26/20  OCT, Retina - OU - Both Eyes       Right Eye Quality was good. Scan locations included subfoveal. Central Foveal Thickness: 385. Progression has no prior data. Findings include epiretinal membrane, cystoid macular edema.   Left Eye Quality was good. Scan locations included subfoveal. Progression has no prior data. Findings include vitreomacular adhesion , normal foveal contour.   Notes Photoreceptor disruption in the layers central fovea accounting for acuity, OD     Color Fundus Photography Optos - OU - Both Eyes       Right  Eye Progression has no prior data. Disc findings include normal observations. Macula : epiretinal membrane. Vessels : normal observations. Periphery : normal observations.   Left Eye Progression has no prior data. Disc findings include normal observations. Macula : normal observations. Vessels : normal observations. Periphery : normal observations.              ASSESSMENT/PLAN:  Nuclear sclerotic cataract of both eyes Mild to moderate moderate central NSC changes OD mostly with yellow coloring changes however some hardening of the central nucleus is evident.  Nonetheless clear media for potential vitrectomy membrane peel if patient so chooses  I did discuss with the patient that vitrectomy membrane peel in the right eye to recover acuity would and physiologic since trigger progression of nuclear sclerotic cataract changes over the next 6 to 12 months typically in this age group, leading to the requirement for likely cataract surgery and intraocular lens placement in the right eye  Macular puckering, right eye The nature of macular pucker (epiretinal membrane ERM) was discussed with the patient as well as threshold criteria for vitrectomy surgery. I explained that in rare cases another surgery is needed to actually remove a second wrinkle should it regrow.  Most often, the epiretinal membrane and underlying wrinkled internal limiting membrane are removed with the first surgery, to accomplish the goals.   If the operative eye is Phakic (natural lens still present), cataract surgery is often recommended prior to Vitrectomy. This will enable the retina surgeon to have the best view during surgery and the patient to obtain optimal results in the future. Treatment options were discussed.  I have recommended at home monitoring the near vision task in a monocular (1 eye at a time), with or without near vision glasses, to look for changes or declines in reading.      ICD-10-CM   1. Macular  puckering, right eye  H35.371 OCT, Retina - OU - Both Eyes    Color Fundus Photography Optos - OU - Both Eyes    2. Nuclear sclerotic cataract of both eyes  H25.13  1.  OD, with macular puckering and now with visual symptoms.  Patient was informed and views of successful ERM removal in this similar case were reviewed and displayed to the patient.  2.  Visual acuity symptoms I explained the patient will not improve with observation and could stabilize but more likely will continue to worsen with ongoing observation.    3.  Notably the intrinsic cystoid macular changes noted in the foveal region as well as photoreceptor disruption in the outer retina are likely to progress which could lead to more permanent damage in the coming months.  For this reason of asked the patient to consider vitrectomy and membrane [phone], under local MAC anesthesia.  He had questions regarding the use of possible general anesthesia which if required or if his so chooses would not lengthen the duration of the surgery appreciably, except the duration of recovery would lengthen him to approximately 40 to 45 minutes post use of LMA device, not endotracheal tube  Ophthalmic Meds Ordered this visit:  No orders of the defined types were placed in this encounter.      Return ,,, [facility], when patient wishes to proceed, for OD, schedule vitrectomy membrane [phone].  There are no Patient Instructions on file for this visit.   Explained the diagnoses, plan, and follow up with the patient and they expressed understanding.  Patient expressed understanding of the importance of proper follow up care.   Clent Demark Luigi Stuckey M.D. Diseases & Surgery of the Retina and Vitreous [facility] [place] 12/26/20     Abbreviations: M myopia (nearsighted); A astigmatism; H hyperopia (farsighted); P presbyopia; Mrx spectacle prescription;  CTL contact lenses; OD right eye; OS left eye; OU both eyes   XT exotropia; ET esotropia; PEK punctate epithelial keratitis; PEE punctate epithelial erosions; DES dry eye syndrome; MGD meibomian gland dysfunction; ATs artificial tears; PFAT's preservative free artificial tears; [place] nuclear sclerotic cataract; PSC posterior subcapsular cataract; ERM epi-retinal membrane; PVD posterior vitreous detachment; RD retinal detachment; DM diabetes mellitus; DR diabetic retinopathy; NPDR non-proliferative diabetic retinopathy; PDR proliferative diabetic retinopathy; CSME clinically significant macular edema; DME diabetic macular edema; dbh dot blot hemorrhages; CWS cotton wool spot; POAG primary open angle glaucoma; C/D cup-to-disc ratio; HVF humphrey visual field; GVF goldmann visual field; OCT optical coherence tomography; IOP intraocular pressure; BRVO Branch retinal vein occlusion; CRVO central retinal vein occlusion; CRAO central retinal artery occlusion; BRAO branch retinal artery occlusion; RT retinal tear; SB scleral buckle; PPV pars plana vitrectomy; VH Vitreous hemorrhage; PRP panretinal laser photocoagulation; IVK intravitreal kenalog; VMT vitreomacular traction; MH Macular hole;  NVD neovascularization of the disc; NVE neovascularization elsewhere; AREDS age related eye disease study; ARMD age related macular degeneration; POAG primary open angle glaucoma; EBMD epithelial/anterior basement membrane dystrophy; ACIOL anterior chamber intraocular lens; IOL intraocular lens; PCIOL posterior chamber intraocular lens; Phaco/IOL phacoemulsification with intraocular lens placement; [place] photorefractive keratectomy; LASIK laser assisted in situ keratomileusis; HTN hypertension; DM diabetes mellitus; COPD chronic obstructive pulmonary disease

## 2020-12-26 NOTE — Assessment & Plan Note (Signed)
Mild to moderate moderate central NSC changes OD mostly with yellow coloring changes however some hardening of the central nucleus is evident.  Nonetheless clear media for potential vitrectomy membrane peel if patient so chooses  I did discuss with the patient that vitrectomy membrane peel in the right eye to recover acuity would and physiologic since trigger progression of nuclear sclerotic cataract changes over the next 6 to 12 months typically in this age group, leading to the requirement for likely cataract surgery and intraocular lens placement in the right eye

## 2021-01-11 ENCOUNTER — Ambulatory Visit (INDEPENDENT_AMBULATORY_CARE_PROVIDER_SITE_OTHER): Payer: 59 | Admitting: Sports Medicine

## 2021-01-11 VITALS — Ht 75.5 in | Wt 208.0 lb

## 2021-01-11 DIAGNOSIS — R208 Other disturbances of skin sensation: Secondary | ICD-10-CM

## 2021-01-11 DIAGNOSIS — M545 Low back pain, unspecified: Secondary | ICD-10-CM

## 2021-01-11 DIAGNOSIS — G8929 Other chronic pain: Secondary | ICD-10-CM | POA: Diagnosis not present

## 2021-01-11 MED ORDER — GABAPENTIN 300 MG PO CAPS: ORAL_CAPSULE | ORAL | 11 refills | Status: DC

## 2021-01-11 NOTE — Assessment & Plan Note (Signed)
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

## 2021-01-11 NOTE — Progress Notes (Signed)
PCP: Creola Corn, MD  Subjective:   HPI: Patient is a 65 y.o. male living with bilateral plantar neuropathy from spinal stenosis here for evaluation of this problem.  He is doing daily stretches and his low back pain is been intermittent.  The numbness and tingling in his toes and plantar surface of his foot continue to be severe at times.  Gabapentin helps him manage this pain.  In addition he is able to prop his legs up on a stool and flex his toes which helped the pain.  This pain can keep him from doing activities he enjoys like walking more than 10 minutes and at times playing tennis.  He is not interested and surgery at this time as it is not causing him any weakness. No side effects from gabapentin.    Past Medical History:  Diagnosis Date   Spinal stenosis     No current outpatient medications on file prior to visit.   No current facility-administered medications on file prior to visit.    No past surgical history on file.  No Known Allergies  Ht 6' 3.5" (1.918 m)   Wt 208 lb (94.3 kg)   BMI 25.66 kg/m   [facility] 01/13/2020 01/11/2021  Frequency of aerobic exercise (# of days/week) 3 2  Average time in minutes > 90 > 90  Frequency of strengthening activities (# of days/week) 3 3    No flowsheet data found.      Objective:  Physical Exam:  Gen: NAD, comfortable in exam room No gross deformity, scoliosis. Strength LEs 5/5 all muscle groups.   2+ MSRs in patellar and achilles tendons, equal bilaterally. Sensation intact to light touch bilaterally. Negative Tinel sign    Assessment & Plan:  1.  Bilateral plantar neuropathy.  Etiology from lumbar spinal stenosis seen on MRI 2020. Patient exam suggest this is not tarsal tunnel syndrome. Other etiologies , B12 def, folate def, hypothyroidism, and diabetic neuropathy considered when he initially presented. Patient reports screening labs with PCP and he is not vegetarian.  - Continue  Gabapentin - Follow up in 1 year, or as needed for worsening  I observed and examined the patient with the resident and agree with assessment and plan.  Note reviewed and modified by me. Sterling Big, MD

## 2021-01-16 ENCOUNTER — Encounter (INDEPENDENT_AMBULATORY_CARE_PROVIDER_SITE_OTHER): Payer: Self-pay | Admitting: Ophthalmology

## 2021-01-16 ENCOUNTER — Other Ambulatory Visit: Payer: Self-pay

## 2021-01-16 ENCOUNTER — Ambulatory Visit (INDEPENDENT_AMBULATORY_CARE_PROVIDER_SITE_OTHER): Payer: 59 | Admitting: Ophthalmology

## 2021-01-16 DIAGNOSIS — H35371 Puckering of macula, right eye: Secondary | ICD-10-CM

## 2021-01-16 NOTE — Assessment & Plan Note (Signed)
The nature of macular pucker (epiretinal membrane ERM) was discussed with the patient as well as threshold criteria for vitrectomy surgery. I explained that in rare cases another surgery is needed to actually remove a second wrinkle should it regrow.  Most often, the epiretinal membrane and underlying wrinkled internal limiting membrane are removed with the first surgery, to accomplish the goals.   If the operative eye is Phakic (natural lens still present), cataract surgery is often recommended prior to Vitrectomy. This will enable the retina surgeon to have the best view during surgery and the patient to obtain optimal results in the future. Treatment options were discussed.  I have recommended at home monitoring the near vision task in a monocular (1 eye at a time), with or without near vision glasses, to look for changes or declines in reading.  OD, no interval change over the last 3 weeks of the epiretinal membrane or in the outer retinal changes in the Photoreceptor layer.  We discussed the likelihood that visual acuity stabilization and improvement may take weeks on most occasions yet I have seen improvement in the distortion and short 1 to 3-day.  Postop.  I also explained the patient that there is no planned injection of gas for the right eye so that he is likely to be able to fly within a short period postsurgical intervention.  Nonetheless medications will be used for 3 weeks after surgery, topically

## 2021-01-16 NOTE — Progress Notes (Signed)
01/16/2021     CHIEF COMPLAINT Patient presents for  Chief Complaint  Patient presents with   Retina Follow Up      HISTORY OF PRESENT ILLNESS: Ryan Fox is a 65 y.o. male who presents to the clinic today for:   HPI     Retina Follow Up   Patient presents with  Other.  In right eye.  This started 3 weeks ago.  Severity is mild.  Duration of 3 weeks.  Since onset it is stable.        Comments   3 week fu Od and OCT/ Possible Pre-op Pt states [facility] OU stable since last visit. Pt denies FOL, floaters, or ocular pain OU.        Last edited by Demetrios Loll, COA on 01/16/2021 10:44 AM.      Referring physician: Creola Corn, MD [address] [place],  [place] [address]  HISTORICAL INFORMATION:   Selected notes from the MEDICAL RECORD NUMBER       CURRENT MEDICATIONS: No current outpatient medications on file. (Ophthalmic Drugs)   No current facility-administered medications for this visit. (Ophthalmic Drugs)   Current Outpatient Medications (Other)  Medication Sig   gabapentin (NEURONTIN) 300 MG capsule TAKE 3 CAPSULES(900 MG) BY MOUTH THREE TIMES DAILY   No current facility-administered medications for this visit. (Other)      REVIEW OF SYSTEMS:    ALLERGIES No Known Allergies  PAST MEDICAL HISTORY Past Medical History:  Diagnosis Date   Spinal stenosis    History reviewed. No pertinent surgical history.  FAMILY HISTORY Family History  Problem Relation Age of Onset   Glaucoma Mother    Macular degeneration Mother    Glaucoma Maternal Grandfather     SOCIAL HISTORY Social History   Tobacco Use   Smoking status: Never   Smokeless tobacco: Never         OPHTHALMIC EXAM:  Base Eye Exam     Visual Acuity (ETDRS)       Right Left   Dist cc 20/30 -1 20/20   Dist ph cc NI     Correction: Glasses         Tonometry (Tonopen, 10:47 AM)       Right Left   Pressure 19 19         Pupils       Pupils React APD   Right  PERRL Brisk None   Left PERRL Brisk None         Visual Fields (Counting fingers)       Left Right    Full Full         Extraocular Movement       Right Left    Full Full         Neuro/Psych     Oriented x3: Yes   Mood/Affect: Normal         Dilation     Right eye: 1.0% Mydriacyl, 2.5% Phenylephrine @ 10:47 AM           Slit Lamp and Fundus Exam     External Exam       Right Left   External Normal Normal         Slit Lamp Exam       Right Left   Lids/Lashes Normal Normal   Conjunctiva/Sclera White and quiet White and quiet   Cornea Clear Clear   Anterior Chamber Deep and quiet Deep and quiet   Iris Round and  reactive Round and reactive   Lens 2+ Nuclear sclerosis 1.5+ Nuclear sclerosis   Anterior Vitreous Normal Normal         Fundus Exam       Right Left   Posterior Vitreous Normal Normal   Disc Normal Normal   C/D Ratio 0.1 0.15   Macula Epiretinal membrane, moderate to severe topographic distortion Normal   Vessels Normal Normal   Periphery Normal Normal            IMAGING AND PROCEDURES  Imaging and Procedures for 01/16/21  OCT, Retina - OU - Both Eyes       Right Eye Quality was good. Scan locations included subfoveal. Central Foveal Thickness: 387. Progression has no prior data. Findings include epiretinal membrane, cystoid macular edema.   Left Eye Quality was good. Scan locations included subfoveal. Central Foveal Thickness: 312. Progression has no prior data. Findings include vitreomacular adhesion , normal foveal contour.   Notes Photoreceptor disruption in the layers central fovea accounting for acuity, OD,,no interval changes             ASSESSMENT/PLAN:  Macular puckering, right eye The nature of macular pucker (epiretinal membrane ERM) was discussed with the patient as well as threshold criteria for vitrectomy surgery. I explained that in rare cases another surgery is needed to actually remove a  second wrinkle should it regrow.  Most often, the epiretinal membrane and underlying wrinkled internal limiting membrane are removed with the first surgery, to accomplish the goals.   If the operative eye is [place] (natural lens still present), cataract surgery is often recommended prior to Vitrectomy. This will enable the retina surgeon to have the best view during surgery and the patient to obtain optimal results in the future. Treatment options were discussed.  I have recommended at home monitoring the near vision task in a monocular (1 eye at a time), with or without near vision glasses, to look for changes or declines in reading.  OD, no interval change over the last 3 weeks of the epiretinal membrane or in the outer retinal changes in the Photoreceptor layer.  We discussed the likelihood that visual acuity stabilization and improvement may take weeks on most occasions yet I have seen improvement in the distortion and short 1 to 3-day.  Postop.  I also explained the patient that there is no planned injection of gas for the right eye so that he is likely to be able to fly within a short period postsurgical intervention.  Nonetheless medications will be used for 3 weeks after surgery, topically     ICD-10-CM   1. Macular puckering, right eye  H35.371 OCT, Retina - OU - Both Eyes      1.  OD, risk benefits of surgical intervention reviewed with the patient.  I reviewed a number of questions that he had prior to surgery.  We will discussed with him the use of topical drops medications to commence 2 days prior to surgery  2.  3.  Ophthalmic Meds Ordered this visit:  No orders of the defined types were placed in this encounter.      Return ,, [facility], [place], [facility], for Schedule vitrectomy membrane [phone], OD.  Patient Instructions  Prednisolone acetate 1% 1 drop right eye 4 times daily, commence 2 days prior to surgery  Ocuflox, 1 drop right eye 4 times daily,  commence 2 days prior to surgery   Explained the diagnoses, plan, and follow up with the patient and  they expressed understanding.  Patient expressed understanding of the importance of proper follow up care.   Alford Highland Ave Scharnhorst M.D. Diseases & Surgery of the Retina and Vitreous [facility] 01/16/21     Abbreviations: M myopia (nearsighted); A astigmatism; H hyperopia (farsighted); P presbyopia; Mrx spectacle prescription;  CTL contact lenses; OD right eye; OS left eye; OU both eyes  XT exotropia; ET esotropia; PEK punctate epithelial keratitis; PEE punctate epithelial erosions; DES dry eye syndrome; MGD meibomian gland dysfunction; ATs artificial tears; PFAT's preservative free artificial tears; NSC nuclear sclerotic cataract; PSC posterior subcapsular cataract; ERM epi-retinal membrane; PVD posterior vitreous detachment; RD retinal detachment; DM diabetes mellitus; DR diabetic retinopathy; NPDR non-proliferative diabetic retinopathy; PDR proliferative diabetic retinopathy; CSME clinically significant macular edema; DME diabetic macular edema; dbh dot blot hemorrhages; CWS cotton wool spot; POAG primary open angle glaucoma; C/D cup-to-disc ratio; HVF humphrey visual field; GVF goldmann visual field; OCT optical coherence tomography; IOP intraocular pressure; BRVO Branch retinal vein occlusion; CRVO central retinal vein occlusion; CRAO central retinal artery occlusion; BRAO branch retinal artery occlusion; RT retinal tear; SB scleral buckle; PPV pars plana vitrectomy; VH Vitreous hemorrhage; PRP panretinal laser photocoagulation; IVK intravitreal kenalog; VMT vitreomacular traction; MH Macular hole;  NVD neovascularization of the disc; NVE neovascularization elsewhere; AREDS age related eye disease study; ARMD age related macular degeneration; POAG primary open angle glaucoma; EBMD epithelial/anterior basement membrane dystrophy; ACIOL anterior chamber intraocular lens; IOL intraocular  lens; PCIOL posterior chamber intraocular lens; Phaco/IOL phacoemulsification with intraocular lens placement; PRK photorefractive keratectomy; LASIK laser assisted in situ keratomileusis; HTN hypertension; DM diabetes mellitus; COPD chronic obstructive pulmonary disease

## 2021-01-16 NOTE — Patient Instructions (Signed)
Prednisolone acetate 1% 1 drop right eye 4 times daily, commence 2 days prior to surgery  Ocuflox, 1 drop right eye 4 times daily, commence 2 days prior to surgery

## 2021-03-07 ENCOUNTER — Encounter (INDEPENDENT_AMBULATORY_CARE_PROVIDER_SITE_OTHER): Payer: Self-pay

## 2021-03-07 ENCOUNTER — Ambulatory Visit (INDEPENDENT_AMBULATORY_CARE_PROVIDER_SITE_OTHER): Payer: 59

## 2021-03-07 ENCOUNTER — Other Ambulatory Visit: Payer: Self-pay

## 2021-03-07 DIAGNOSIS — H35371 Puckering of macula, right eye: Secondary | ICD-10-CM

## 2021-03-07 MED ORDER — OFLOXACIN 0.3 % OP SOLN: 1.0000 [drp] | Freq: Four times a day (QID) | OPHTHALMIC | 0 refills | Status: AC

## 2021-03-07 MED ORDER — PREDNISOLONE ACETATE 1 % OP SUSP: 1.0000 [drp] | Freq: Four times a day (QID) | OPHTHALMIC | 0 refills | Status: AC

## 2021-03-07 NOTE — Progress Notes (Incomplete)
    03/07/2021     CHIEF COMPLAINT Patient presents for Pre-op Exam   HISTORY OF PRESENT ILLNESS: Ryan Fox is a 65 y.o. male who presents to the clinic today for:   HPI   Pt will have PPV/membrane peel OD at [facility] on 03/21/2021 Pt states VA OU stable since last visit. Pt denies FOL, floaters, or ocular pain OU.  Pt reports that there are no changes to his vision that he can tell  Last edited by Demetrios Loll, COA on 03/07/2021 10:47 AM.        HISTORICAL INFORMATION:   Selected notes from the MEDICAL RECORD NUMBER       CURRENT MEDICATIONS: No current outpatient medications on file. (Ophthalmic Drugs)   No current facility-administered medications for this visit. (Ophthalmic Drugs)   Current Outpatient Medications (Other)  Medication Sig   gabapentin (NEURONTIN) 300 MG capsule TAKE 3 CAPSULES(900 MG) BY MOUTH THREE TIMES DAILY   No current facility-administered medications for this visit. (Other)     ALLERGIES No Known Allergies  PAST MEDICAL HISTORY Past Medical History:  Diagnosis Date   Spinal stenosis    History reviewed. No pertinent surgical history.  FAMILY HISTORY Family History  Problem Relation Age of Onset   Glaucoma Mother    Macular degeneration Mother    Glaucoma Maternal Grandfather     SOCIAL HISTORY Social History   Tobacco Use   Smoking status: Never   Smokeless tobacco: Never         OPHTHALMIC EXAM:  Base Eye Exam     Visual Acuity (ETDRS)       Right Left   Dist cc 20/30 +1 20/20   Dist ph cc NI          Tonometry (Tonopen, 10:51 AM)       Right Left   Pressure 14 11         Pupils       Pupils Shape React APD   Right PERRL Round Brisk None   Left PERRL Round Brisk None         Visual Fields (Counting fingers)       Left Right    Full Full         Extraocular Movement       Right Left    Full Full         Neuro/Psych     Oriented x3: Yes         Dilation     Both eyes: No  Dilation @ 10:50 AM            IMAGING AND PROCEDURES  Imaging and Procedures for @TODAY @           ASSESSMENT/PLAN:  No diagnosis found.  Ophthalmic Meds Ordered this visit:  No orders of the defined types were placed in this encounter.       Pre-op completed. Operative consent obtained with pre-op eye drops reviewed with and sent via [facility] as needed. Post op instructions reviewed with patient and per patient all questions answered.  Sena Hoopingarner L [facility], COA

## 2021-03-21 ENCOUNTER — Encounter (AMBULATORY_SURGERY_CENTER): Payer: 59 | Admitting: Ophthalmology

## 2021-03-21 DIAGNOSIS — H35371 Puckering of macula, right eye: Secondary | ICD-10-CM | POA: Diagnosis not present

## 2021-03-22 ENCOUNTER — Encounter (INDEPENDENT_AMBULATORY_CARE_PROVIDER_SITE_OTHER): Payer: Self-pay | Admitting: Ophthalmology

## 2021-03-22 ENCOUNTER — Other Ambulatory Visit: Payer: Self-pay

## 2021-03-22 ENCOUNTER — Ambulatory Visit (INDEPENDENT_AMBULATORY_CARE_PROVIDER_SITE_OTHER): Payer: 59 | Admitting: Ophthalmology

## 2021-03-22 DIAGNOSIS — H35371 Puckering of macula, right eye: Secondary | ICD-10-CM

## 2021-03-22 NOTE — Patient Instructions (Signed)
Ofloxacin  4 times daily to the operative eye  Prednisolone acetate 1 drop to the operative eye 4 times daily  Patient instructed not to refill the medications and use them for maximum of 3 weeks.  Patient instructed do not rub the eye.  Patient has the option to use the patch at night.   Patient instructed not to intentionally increase his heart rate with activity.  Patient encouraged not to do any heavy straining lifting overdo bending and particularly treat constipation should it occur

## 2021-03-22 NOTE — Progress Notes (Signed)
03/22/2021     CHIEF COMPLAINT Patient presents for  Chief Complaint  Patient presents with   Post-op Follow-up      HISTORY OF PRESENT ILLNESS: Ryan Fox is a 65 y.o. male who presents to the clinic today for:   HPI     Post-op Follow-up           Laterality: right eye   Discomfort: Negative for pain, itching, foreign body sensation, tearing and discharge         Comments   1 day post op OD- PPV/Membrane Peel OD on 03/21/2021  Pt states, "I feel like I done remarkably well last night. I did not have any issues." Pt denies any FB sensation. No pain.   Pt reports using Ofloxacin and Pred QID OD       Last edited by Demetrios Loll, COA on 03/22/2021  8:29 AM.      Referring physician: Creola Corn, MD [address] [place],  [place] [address]  HISTORICAL INFORMATION:   Selected notes from the MEDICAL RECORD NUMBER       CURRENT MEDICATIONS: No current outpatient medications on file. (Ophthalmic Drugs)   No current facility-administered medications for this visit. (Ophthalmic Drugs)   Current Outpatient Medications (Other)  Medication Sig   gabapentin (NEURONTIN) 300 MG capsule TAKE 3 CAPSULES(900 MG) BY MOUTH THREE TIMES DAILY   No current facility-administered medications for this visit. (Other)      REVIEW OF SYSTEMS:    ALLERGIES No Known Allergies  PAST MEDICAL HISTORY Past Medical History:  Diagnosis Date   Spinal stenosis    History reviewed. No pertinent surgical history.  FAMILY HISTORY Family History  Problem Relation Age of Onset   Glaucoma Mother    Macular degeneration Mother    Glaucoma Maternal Grandfather     SOCIAL HISTORY Social History   Tobacco Use   Smoking status: Never   Smokeless tobacco: Never         OPHTHALMIC EXAM:  Base Eye Exam     Visual Acuity (ETDRS)       Right Left   Dist [place] 20/100 -1 20/40 -1   Dist ph [place] NI 20/20         Tonometry (Tonopen, 8:33 AM)       Right Left    Pressure 11 14         Pupils       Dark Shape React APD   Right D[employer] Round Minimal None   Left  Round Brisk None         Extraocular Movement       Right Left    Full, Ortho Full, Ortho         Neuro/Psych     Oriented x3: Yes   Mood/Affect: Normal         Dilation     Right eye: 1.0% Mydriacyl, 2.5% Phenylephrine @ 8:34 AM           Slit Lamp and Fundus Exam     External Exam       Right Left   External Normal Normal         Slit Lamp Exam       Right Left   Lids/Lashes Normal Normal   Conjunctiva/Sclera White and quiet White and quiet   Cornea Clear Clear   Anterior Chamber Deep and quiet Deep and quiet   Iris Round and reactive Round and reactive   Lens 2+ Nuclear  sclerosis 1.5+ Nuclear sclerosis   Anterior Vitreous Normal Normal         Fundus Exam       Right Left   Posterior Vitreous Normal    Disc Normal    C/D Ratio 0.1    Macula No topographic distortion, no ILM striae, post ILM removal appearance    Vessels Normal    Periphery Normal             IMAGING AND PROCEDURES  Imaging and Procedures for 03/22/21           ASSESSMENT/PLAN:  Macular puckering, right eye Postop day #1 vitrectomy membrane peel looks great, patient reports no more distortion on vertical lines     ICD-10-CM   1. Macular puckering, right eye  H35.371       1.  Patient to commence topical medications in the right eye today  2.  3.  Ophthalmic Meds Ordered this visit:  No orders of the defined types were placed in this encounter.      Return in about 1 week (around 03/29/2021) for OD, OCT, dilate, POST OP.  Patient Instructions  Ofloxacin  4 times daily to the operative eye  Prednisolone acetate 1 drop to the operative eye 4 times daily  Patient instructed not to refill the medications and use them for maximum of 3 weeks.  Patient instructed do not rub the eye.  Patient has the option to use the patch at night.    Patient instructed not to intentionally increase his heart rate with activity.  Patient encouraged not to do any heavy straining lifting overdo bending and particularly treat constipation should it occur     Explained the diagnoses, plan, and follow up with the patient and they expressed understanding.  Patient expressed understanding of the importance of proper follow up care.   Alford Highland Diane Hanel M.D. Diseases & Surgery of the Retina and Vitreous [facility] 03/22/21     Abbreviations: M myopia (nearsighted); A astigmatism; H hyperopia (farsighted); P presbyopia; Mrx spectacle prescription;  CTL contact lenses; OD right eye; OS left eye; OU both eyes  XT exotropia; ET esotropia; PEK punctate epithelial keratitis; PEE punctate epithelial erosions; DES dry eye syndrome; MGD meibomian gland dysfunction; ATs artificial tears; PFAT's preservative free artificial tears; NSC nuclear sclerotic cataract; PSC posterior subcapsular cataract; ERM epi-retinal membrane; PVD posterior vitreous detachment; RD retinal detachment; DM diabetes mellitus; DR diabetic retinopathy; NPDR non-proliferative diabetic retinopathy; PDR proliferative diabetic retinopathy; CSME clinically significant macular edema; DME diabetic macular edema; dbh dot blot hemorrhages; CWS cotton wool spot; POAG primary open angle glaucoma; C/D cup-to-disc ratio; HVF humphrey visual field; GVF goldmann visual field; OCT optical coherence tomography; IOP intraocular pressure; BRVO Branch retinal vein occlusion; CRVO central retinal vein occlusion; CRAO central retinal artery occlusion; BRAO branch retinal artery occlusion; RT retinal tear; SB scleral buckle; PPV pars plana vitrectomy; VH Vitreous hemorrhage; PRP panretinal laser photocoagulation; IVK intravitreal kenalog; VMT vitreomacular traction; MH Macular hole;  NVD neovascularization of the disc; NVE neovascularization elsewhere; AREDS age related eye disease study; ARMD age  related macular degeneration; POAG primary open angle glaucoma; EBMD epithelial/anterior basement membrane dystrophy; ACIOL anterior chamber intraocular lens; IOL intraocular lens; PCIOL posterior chamber intraocular lens; Phaco/IOL phacoemulsification with intraocular lens placement; PRK photorefractive keratectomy; LASIK laser assisted in situ keratomileusis; HTN hypertension; DM diabetes mellitus; COPD chronic obstructive pulmonary disease

## 2021-03-22 NOTE — Assessment & Plan Note (Signed)
Postop day #1 vitrectomy membrane peel looks great, patient reports no more distortion on vertical lines

## 2021-03-29 ENCOUNTER — Encounter (INDEPENDENT_AMBULATORY_CARE_PROVIDER_SITE_OTHER): Payer: Self-pay | Admitting: Ophthalmology

## 2021-03-29 ENCOUNTER — Ambulatory Visit (INDEPENDENT_AMBULATORY_CARE_PROVIDER_SITE_OTHER): Payer: 59 | Admitting: Ophthalmology

## 2021-03-29 ENCOUNTER — Other Ambulatory Visit: Payer: Self-pay

## 2021-03-29 DIAGNOSIS — H35371 Puckering of macula, right eye: Secondary | ICD-10-CM | POA: Diagnosis not present

## 2021-03-29 NOTE — Patient Instructions (Addendum)
Ofloxacin  4 times daily to the operative eye  Prednisolone acetate 1 drop to the operative eye 4 times daily  Patient instructed not to refill the medications and use them for maximum of 3 weeks.  Patient instructed do not rub the eye.  Patient has the option to use the patch at night.    Patient may resume all activity in 3 more days.  Patient instructed not to mash compress rub the eye.  I did encourage the patient to consider home sleep study under the auspices and direction of Creola Corn, MD just to rule out the potential that nightly hypoxic damage to the eye with slow the healing of the macula.

## 2021-03-29 NOTE — Progress Notes (Signed)
03/29/2021     CHIEF COMPLAINT Patient presents for  Chief Complaint  Patient presents with   Post-op Follow-up      HISTORY OF PRESENT ILLNESS: Ryan Fox is a 65 y.o. male who presents to the clinic today for:   HPI     Post-op Follow-up           Laterality: right eye         Comments   1 week post op OD and OCT and Dilate OD Sx on 03/21/2021 PPV/Membrane Peel  Pt states, "I do not feel that things are progressing well. I am still having vertical distortion and today I noticed some horizontal distortion."  Pt reports using Ofloxacin and Pred QID OD       Last edited by Demetrios Loll, COA on 03/29/2021  9:03 AM.      Referring physician: Elise Benne, MD [address] [place],  [place] [address]  HISTORICAL INFORMATION:   Selected notes from the MEDICAL RECORD NUMBER       CURRENT MEDICATIONS: No current outpatient medications on file. (Ophthalmic Drugs)   No current facility-administered medications for this visit. (Ophthalmic Drugs)   Current Outpatient Medications (Other)  Medication Sig   gabapentin (NEURONTIN) 300 MG capsule TAKE 3 CAPSULES(900 MG) BY MOUTH THREE TIMES DAILY   No current facility-administered medications for this visit. (Other)      REVIEW OF SYSTEMS:    ALLERGIES No Known Allergies  PAST MEDICAL HISTORY Past Medical History:  Diagnosis Date   Spinal stenosis    History reviewed. No pertinent surgical history.  FAMILY HISTORY Family History  Problem Relation Age of Onset   Glaucoma Mother    Macular degeneration Mother    Glaucoma Maternal Grandfather     SOCIAL HISTORY Social History   Tobacco Use   Smoking status: Never   Smokeless tobacco: Never         OPHTHALMIC EXAM:  Base Eye Exam     Visual Acuity (ETDRS)       Right Left   Dist cc 20/20 -1 20/20    Correction: Glasses         Tonometry (Tonopen, 9:06 AM)       Right Left   Pressure 12 15         Pupils        Pupils Shape React APD   Right PERRL Round Brisk None   Left PERRL Round Brisk None         Visual Fields (Counting fingers)       Left Right    Full Full         Extraocular Movement       Right Left    Full, Ortho Full, Ortho         Neuro/Psych     Oriented x3: Yes   Mood/Affect: Normal         Dilation     Right eye: 1.0% Mydriacyl, 2.5% Phenylephrine @ 9:06 AM           Slit Lamp and Fundus Exam     External Exam       Right Left   External Normal Normal         Slit Lamp Exam       Right Left   Lids/Lashes Normal Normal   Conjunctiva/Sclera White and quiet White and quiet   Cornea Clear Clear   Anterior Chamber Deep and quiet Deep and quiet  Iris Round and reactive Round and reactive   Lens 2+ Nuclear sclerosis 1.5+ Nuclear sclerosis   Anterior Vitreous Normal Normal         Fundus Exam       Right Left   Posterior Vitreous Clear, avitric    Disc Normal    C/D Ratio 0.1    Macula No topographic distortion, no ILM striae, post ILM removal appearance    Vessels Normal    Periphery Normal             IMAGING AND PROCEDURES  Imaging and Procedures for 03/29/21  OCT, Retina - OU - Both Eyes       Right Eye Quality was good. Scan locations included subfoveal. Central Foveal Thickness: 450. Progression has no prior data.   Left Eye Quality was good. Scan locations included subfoveal. Central Foveal Thickness: 312. Progression has no prior data. Findings include vitreomacular adhesion , normal foveal contour.   Notes OD vastly improved macular anatomy post vitrectomy membrane peel, much less thickening and no residual CME today and outer retinal photoreceptor layer assuming a more normal-appearing             ASSESSMENT/PLAN:  Macular puckering, right eye 1 week postop vitrectomy membrane peel, looks great     ICD-10-CM   1. Macular puckering, right eye  H35.371 OCT, Retina - OU - Both Eyes      1.  OD,  looks great, early healing proceeding nicely.  2.  Patient instructed to continue the topical eye medications resume full activity in 3 more days  3.  Ophthalmic Meds Ordered this visit:  No orders of the defined types were placed in this encounter.      Return in about 6 weeks (around 05/10/2021) for dilate, OD, OCT.  Patient Instructions  Ofloxacin  4 times daily to the operative eye  Prednisolone acetate 1 drop to the operative eye 4 times daily  Patient instructed not to refill the medications and use them for maximum of 3 weeks.  Patient instructed do not rub the eye.  Patient has the option to use the patch at night.    Explained the diagnoses, plan, and follow up with the patient and they expressed understanding.  Patient expressed understanding of the importance of proper follow up care.   Alford Highland Marelin Tat M.D. Diseases & Surgery of the Retina and Vitreous [facility] 03/29/21     Abbreviations: M myopia (nearsighted); A astigmatism; H hyperopia (farsighted); P presbyopia; Mrx spectacle prescription;  CTL contact lenses; OD right eye; OS left eye; OU both eyes  XT exotropia; ET esotropia; PEK punctate epithelial keratitis; PEE punctate epithelial erosions; DES dry eye syndrome; MGD meibomian gland dysfunction; ATs artificial tears; PFAT's preservative free artificial tears; NSC nuclear sclerotic cataract; PSC posterior subcapsular cataract; ERM epi-retinal membrane; PVD posterior vitreous detachment; RD retinal detachment; DM diabetes mellitus; DR diabetic retinopathy; NPDR non-proliferative diabetic retinopathy; PDR proliferative diabetic retinopathy; CSME clinically significant macular edema; DME diabetic macular edema; dbh dot blot hemorrhages; CWS cotton wool spot; POAG primary open angle glaucoma; C/D cup-to-disc ratio; HVF humphrey visual field; GVF goldmann visual field; OCT optical coherence tomography; IOP intraocular pressure; BRVO Branch retinal vein  occlusion; CRVO central retinal vein occlusion; CRAO central retinal artery occlusion; BRAO branch retinal artery occlusion; RT retinal tear; SB scleral buckle; PPV pars plana vitrectomy; VH Vitreous hemorrhage; PRP panretinal laser photocoagulation; IVK intravitreal kenalog; VMT vitreomacular traction; MH Macular hole;  NVD neovascularization of the  disc; NVE neovascularization elsewhere; AREDS age related eye disease study; ARMD age related macular degeneration; POAG primary open angle glaucoma; EBMD epithelial/anterior basement membrane dystrophy; ACIOL anterior chamber intraocular lens; IOL intraocular lens; PCIOL posterior chamber intraocular lens; Phaco/IOL phacoemulsification with intraocular lens placement; [place] photorefractive keratectomy; LASIK laser assisted in situ keratomileusis; HTN hypertension; DM diabetes mellitus; COPD chronic obstructive pulmonary disease

## 2021-03-29 NOTE — Assessment & Plan Note (Signed)
1 week postop vitrectomy membrane peel, looks great

## 2021-05-10 ENCOUNTER — Ambulatory Visit (INDEPENDENT_AMBULATORY_CARE_PROVIDER_SITE_OTHER): Payer: 59 | Admitting: Ophthalmology

## 2021-05-10 ENCOUNTER — Other Ambulatory Visit: Payer: Self-pay

## 2021-05-10 ENCOUNTER — Encounter (INDEPENDENT_AMBULATORY_CARE_PROVIDER_SITE_OTHER): Payer: Self-pay | Admitting: Ophthalmology

## 2021-05-10 DIAGNOSIS — H35371 Puckering of macula, right eye: Secondary | ICD-10-CM

## 2021-05-10 DIAGNOSIS — H2513 Age-related nuclear cataract, bilateral: Secondary | ICD-10-CM

## 2021-05-10 NOTE — Progress Notes (Signed)
05/10/2021     CHIEF COMPLAINT Patient presents for  Chief Complaint  Patient presents with   Post-op Follow-up      HISTORY OF PRESENT ILLNESS: Ryan Fox is a 66 y.o. male who presents to the clinic today for:   HPI     Post-op Follow-up           Laterality: right eye   Discomfort: Negative for pain, itching, foreign body sensation and tearing   Vision: is stable         Comments   6 week post op- dilate OD and OCT  Pt states," I still have wavy lines that are horizontal as well as vertical that are an issue for me since surgery." Pt states VA OU stable since last visit. Pt denies FOL, floaters, or ocular pain OU.        Last edited by Demetrios Loll, COA on 05/10/2021  8:23 AM.      Referring physician: Elise Benne, MD [address] [place],  [place] [address]  HISTORICAL INFORMATION:   Selected notes from the MEDICAL RECORD NUMBER       CURRENT MEDICATIONS: No current outpatient medications on file. (Ophthalmic Drugs)   No current facility-administered medications for this visit. (Ophthalmic Drugs)   Current Outpatient Medications (Other)  Medication Sig   gabapentin (NEURONTIN) 300 MG capsule TAKE 3 CAPSULES(900 MG) BY MOUTH THREE TIMES DAILY   No current facility-administered medications for this visit. (Other)      REVIEW OF SYSTEMS:    ALLERGIES No Known Allergies  PAST MEDICAL HISTORY Past Medical History:  Diagnosis Date   Spinal stenosis    History reviewed. No pertinent surgical history.  FAMILY HISTORY Family History  Problem Relation Age of Onset   Glaucoma Mother    Macular degeneration Mother    Glaucoma Maternal Grandfather     SOCIAL HISTORY Social History   Tobacco Use   Smoking status: Never   Smokeless tobacco: Never         OPHTHALMIC EXAM:  Base Eye Exam     Visual Acuity (ETDRS)       Right Left   Dist cc 20/25 -1 20/15 -1    Correction: Glasses         Tonometry (Tonopen, 8:24 AM)        Right Left   Pressure 13 16         Pupils       Pupils Shape React APD   Right PERRL Round Brisk None   Left PERRL Round Brisk None         Visual Fields (Counting fingers)       Left Right    Full Full         Extraocular Movement       Right Left    Full, Ortho Full, Ortho         Neuro/Psych     Oriented x3: Yes   Mood/Affect: Normal         Dilation     Right eye: 1.0% Mydriacyl, 2.5% Phenylephrine @ 8:24 AM           Slit Lamp and Fundus Exam     External Exam       Right Left   External Normal Normal         Slit Lamp Exam       Right Left   Lids/Lashes Normal Normal   Conjunctiva/Sclera White and quiet White and  quiet   Cornea Clear Clear   Anterior Chamber Deep and quiet Deep and quiet   Iris Round and reactive Round and reactive   Lens 2+ Nuclear sclerosis, no early thickening of central nuclear sclerosis yet some color change seen 1.5+ Nuclear sclerosis   Anterior Vitreous Normal Normal         Fundus Exam       Right Left   Posterior Vitreous Clear, avitric    Disc Normal    C/D Ratio 0.1    Macula No topographic distortion, no ILM striae, post ILM removal appearance    Vessels Normal    Periphery Normal             IMAGING AND PROCEDURES  Imaging and Procedures for 05/10/21           ASSESSMENT/PLAN:  Nuclear sclerotic cataract of both eyes OU, mild to moderate.  OD likely to begin with progressive NSC changes as typical and post vitrectomy eyes in this age group.  I explained the patient that rapid changes in the prescription requirements for the right eye are likely and then finally obscuration of the vision from progressive color change in addition to opacity will lead to the need for cataract surgery.  Also explained anisometropia may trigger similar need  Follow-up with Dr. Craig Guess in 2 to 3 months for early assessment of these issues  Macular puckering, right eye Now some 7  weeks post vitrectomy membrane peel looks great with 20/40 vision preoperatively now improved to 20/25.        ICD-10-CM   1. Macular puckering, right eye  H35.371     2. Nuclear sclerotic cataract of both eyes  H25.13       1.  OD looks great postop vitrectomy, 7 weeks prior.  Patient confirmed to be off medication  2.  Patient should follow-up with Dr. Sharyne Peach within 2 months for updated refraction as likely the change in this phakic right eye.  3.  Patient scheduled to have sleep study simply to determine whether or not he has nightly hypoxic events  Ophthalmic Meds Ordered this visit:  No orders of the defined types were placed in this encounter.      Return in about 6 months (around 11/07/2021) for DILATE OU, OCT.  There are no Patient Instructions on file for this visit.   Explained the diagnoses, plan, and follow up with the patient and they expressed understanding.  Patient expressed understanding of the importance of proper follow up care.   Clent Demark Ally Knodel M.D. Diseases & Surgery of the Retina and Vitreous [facility] 05/10/21     Abbreviations: M myopia (nearsighted); A astigmatism; H hyperopia (farsighted); P presbyopia; Mrx spectacle prescription;  CTL contact lenses; OD right eye; OS left eye; OU both eyes  XT exotropia; ET esotropia; PEK punctate epithelial keratitis; PEE punctate epithelial erosions; DES dry eye syndrome; MGD meibomian gland dysfunction; ATs artificial tears; PFAT's preservative free artificial tears; [place] nuclear sclerotic cataract; PSC posterior subcapsular cataract; ERM epi-retinal membrane; PVD posterior vitreous detachment; RD retinal detachment; DM diabetes mellitus; DR diabetic retinopathy; NPDR non-proliferative diabetic retinopathy; PDR proliferative diabetic retinopathy; CSME clinically significant macular edema; DME diabetic macular edema; dbh dot blot hemorrhages; CWS cotton wool spot; POAG primary open angle  glaucoma; C/D cup-to-disc ratio; HVF humphrey visual field; GVF goldmann visual field; OCT optical coherence tomography; IOP intraocular pressure; BRVO Branch retinal vein occlusion; CRVO central retinal vein occlusion; CRAO central retinal  artery occlusion; BRAO branch retinal artery occlusion; RT retinal tear; SB scleral buckle; PPV pars plana vitrectomy; VH Vitreous hemorrhage; PRP panretinal laser photocoagulation; IVK intravitreal kenalog; VMT vitreomacular traction; MH Macular hole;  NVD neovascularization of the disc; NVE neovascularization elsewhere; AREDS age related eye disease study; ARMD age related macular degeneration; POAG primary open angle glaucoma; EBMD epithelial/anterior basement membrane dystrophy; ACIOL anterior chamber intraocular lens; IOL intraocular lens; PCIOL posterior chamber intraocular lens; Phaco/IOL phacoemulsification with intraocular lens placement; [place] photorefractive keratectomy; LASIK laser assisted in situ keratomileusis; HTN hypertension; DM diabetes mellitus; COPD chronic obstructive pulmonary disease

## 2021-05-10 NOTE — Assessment & Plan Note (Signed)
OU, mild to moderate.  OD likely to begin with progressive NSC changes as typical and post vitrectomy eyes in this age group.  I explained the patient that rapid changes in the prescription requirements for the right eye are likely and then finally obscuration of the vision from progressive color change in addition to opacity will lead to the need for cataract surgery.  Also explained anisometropia may trigger similar need  Follow-up with Dr. Craig Guess in 2 to 3 months for early assessment of these issues

## 2021-05-10 NOTE — Assessment & Plan Note (Signed)
Now some 7 weeks post vitrectomy membrane peel looks great with 20/40 vision preoperatively now improved to 20/25.

## 2021-06-02 IMAGING — MR MR LUMBAR SPINE W/O CM
4 of 5 series · 18 of 48 positions shown · non-contrast
Comparison: Lumbar radiograph 02/18/2019

CLINICAL DATA: Lumbar radiculopathy

EXAM:
MRI LUMBAR SPINE WITHOUT CONTRAST
TECHNIQUE: Multiplanar, multisequence MR imaging of the lumbar spine was
performed. No intravenous contrast was administered.

[Series 7: T1 · sagittal · 4.0mm · 0.73mm/px · 3 of 19 slices shown (1 of 2)]
[im 4/19]
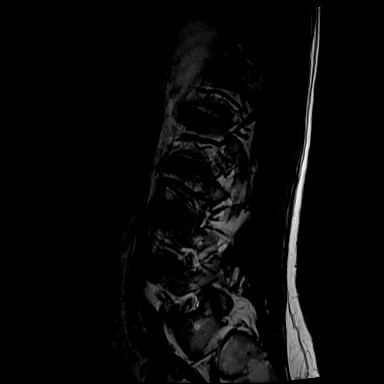
[im 11/19]
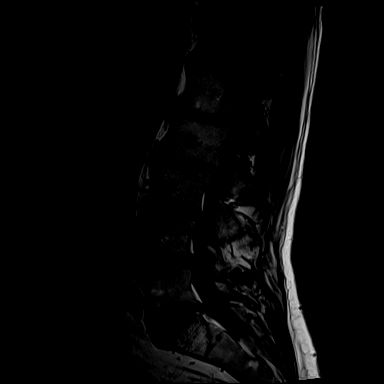
[im 19/19]
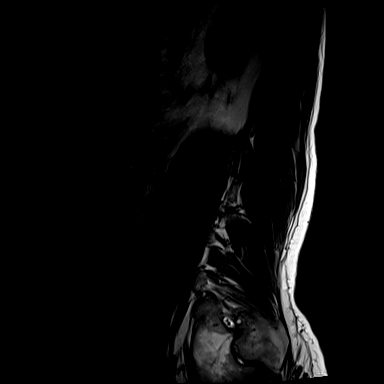

[Series 9: T2 · sagittal · 4.0mm · 0.73mm/px · 6 of 19 slices shown (1 of 2)]
[im 1/19]
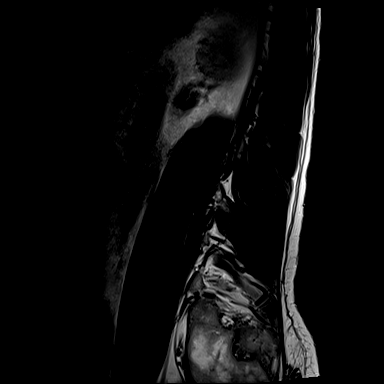
[im 4/19]
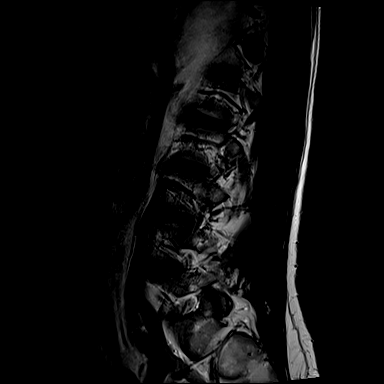
[im 8/19]
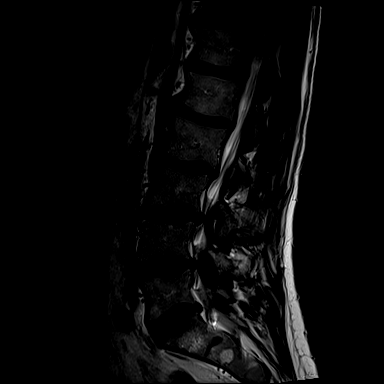
[im 11/19]
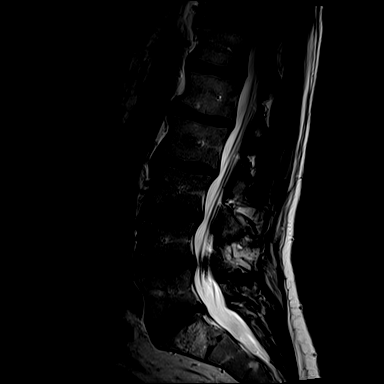
[im 15/19]
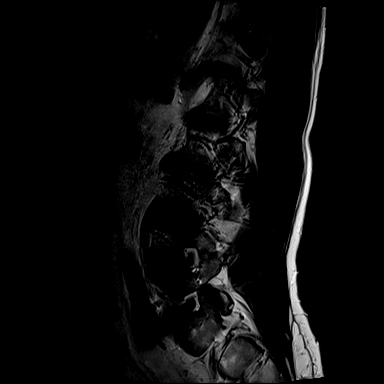
[im 19/19]
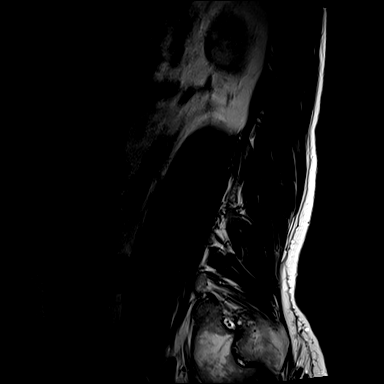

[Series 12: T1 · axial · 4.0mm · 0.28mm/px · z∈[-98,+85]mm · 3 of 46 slices shown (2 of 2)]
[im 7/46]
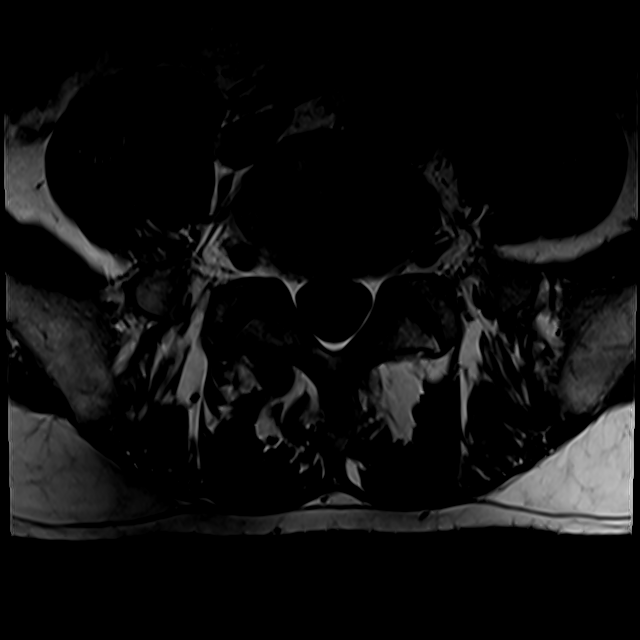
[im 23/46]
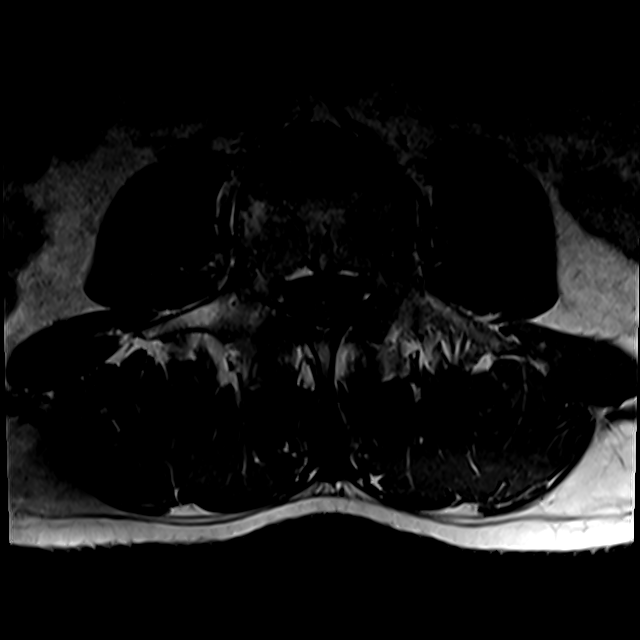
[im 39/46]
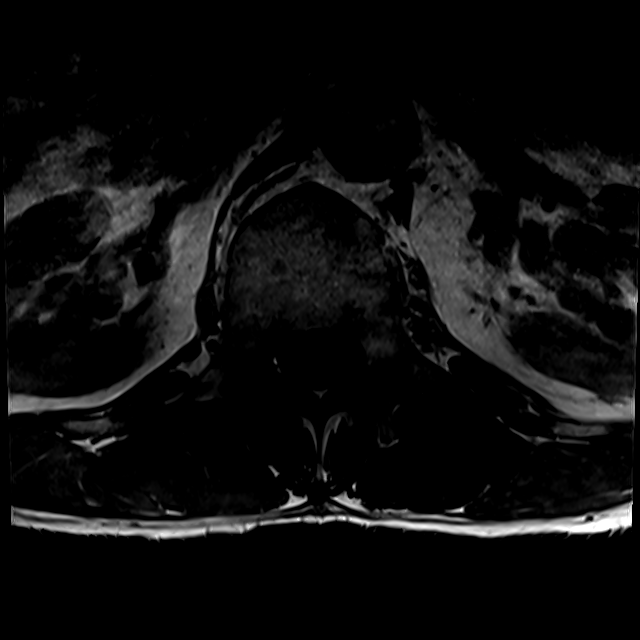

[Series 15: T2 · axial · 4.0mm · 0.28mm/px · z∈[-127,+85]mm · 6 of 46 slices shown (2 of 2)]
[im 1/46]
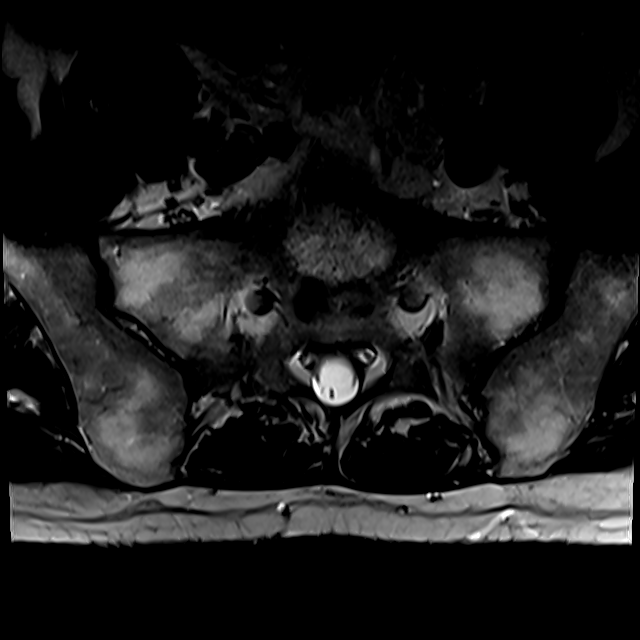
[im 7/46]
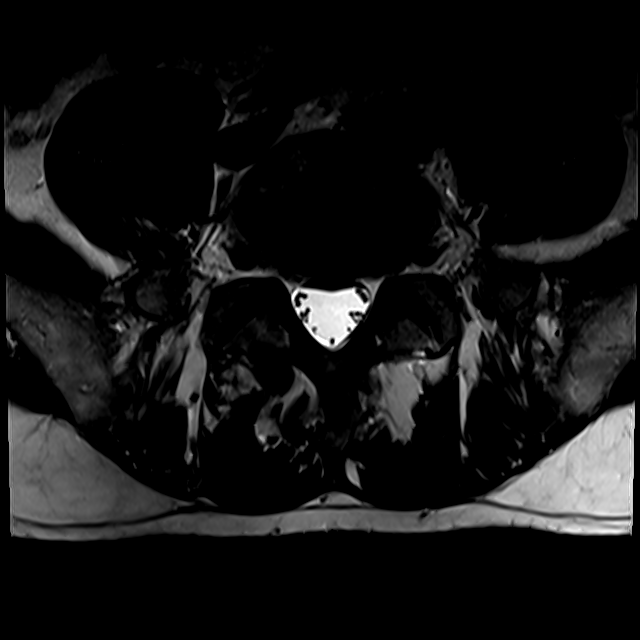
[im 13/46]
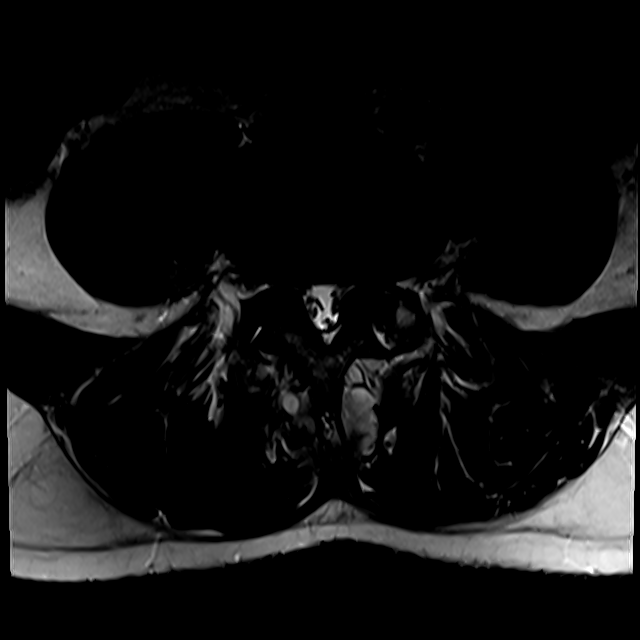
[im 20/46]
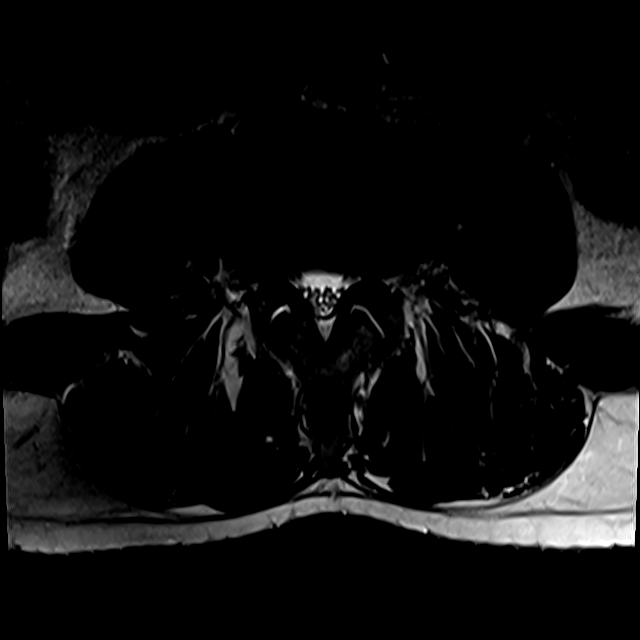
[im 23/46]
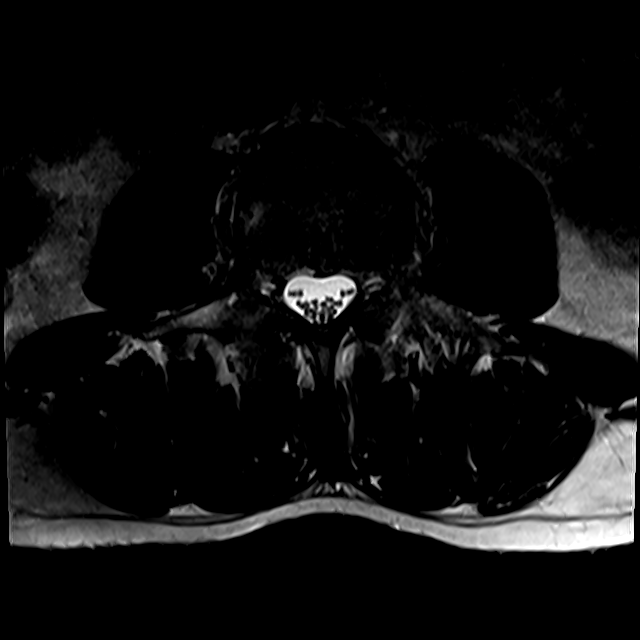
[im 39/46]
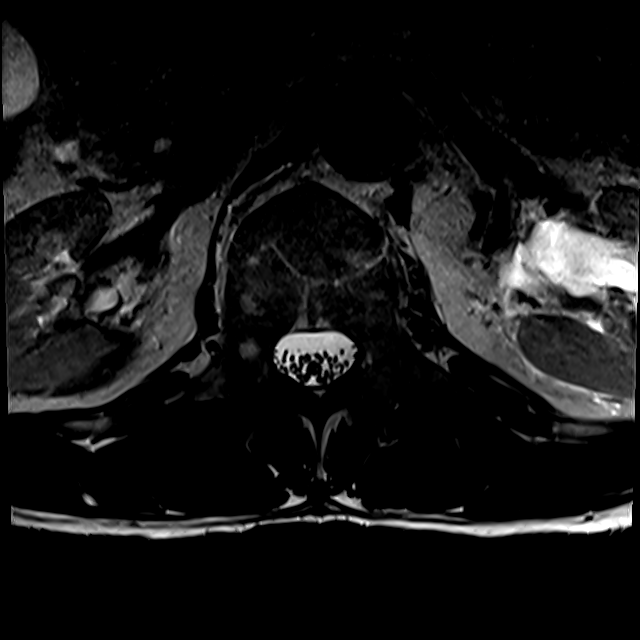

[18 of 48 positions shown; findings below may reference images not displayed]

FINDINGS: Segmentation:  Normal

Alignment:  Slight retrolisthesis L1-2, L2-3, L3-4.

Vertebrae: Hemangioma S1 vertebral body. Negative for fracture or
mass. Normal bone marrow.

Conus medullaris and cauda equina: Conus extends to the L1-2 level.
Conus and cauda equina appear normal.

Paraspinal and other soft tissues: Negative for paraspinous mass or
adenopathy.

Disc levels:

L1-2: Mild disc bulging without significant stenosis

L2-3: Mild disc bulging without significant stenosis.

L3-4: Disc bulging and mild facet degeneration. Mild spinal stenosis
and mild subarticular stenosis bilaterally.

L4-5: Disc degeneration with diffuse disc bulging. Moderate facet
and ligamentum flavum hypertrophy. Moderate spinal stenosis and
moderate subarticular stenosis bilaterally, left greater than right.

L5-S1: Mild disc bulging and mild facet degeneration. Mild
subarticular stenosis bilaterally.
IMPRESSION: Mild spinal stenosis L3-4 with mild subarticular stenosis
bilaterally

Moderate spinal stenosis and moderate subarticular stenosis
bilaterally L4-5

Mild subarticular stenosis bilaterally L5-S1.

## 2021-06-26 ENCOUNTER — Ambulatory Visit (INDEPENDENT_AMBULATORY_CARE_PROVIDER_SITE_OTHER): Payer: 59 | Admitting: Neurology

## 2021-06-26 ENCOUNTER — Other Ambulatory Visit: Payer: Self-pay

## 2021-06-26 ENCOUNTER — Encounter: Payer: Self-pay | Admitting: Neurology

## 2021-06-26 VITALS — BP 110/67 | HR 66 | Ht 75.0 in | Wt 205.6 lb

## 2021-06-26 DIAGNOSIS — R519 Headache, unspecified: Secondary | ICD-10-CM

## 2021-06-26 DIAGNOSIS — E663 Overweight: Secondary | ICD-10-CM | POA: Diagnosis not present

## 2021-06-26 DIAGNOSIS — G478 Other sleep disorders: Secondary | ICD-10-CM

## 2021-06-26 DIAGNOSIS — R351 Nocturia: Secondary | ICD-10-CM

## 2021-06-26 DIAGNOSIS — H35371 Puckering of macula, right eye: Secondary | ICD-10-CM | POA: Diagnosis not present

## 2021-06-26 NOTE — Progress Notes (Signed)
Subjective:  ?  ?Patient ID: Ryan Fox is a 66 y.o. male. ? ?HPI ? ? ? ?Huston Foley, MD, PhD ?[facility] ?[address], [address] ?[phone] ?[place], [place] [address] ? ?Dear Dr. Timothy Lasso,  ? ?I saw your patient, Ryan Fox, upon your kind request in my sleep clinic today for initial consultation of his sleep disorder, in particular, concern for underlying obstructive sleep apnea.  The patient is unaccompanied today.  As you know, Ryan Fox is a 66 year old right-handed gentleman with an underlying medical history of lumbar spinal stenosis, cataracts, macular puckering, and mildly overweight state, who reports that he was advised to seek evaluation for sleep apnea by his retina specialist recently.  He denies any significant snoring or daytime somnolence.  He is not aware of any family history of sleep apnea.  He did have a tonsillectomy as a child.  Epworth sleepiness score is 2 out of 24, fatigue severity score is 9 out of 63.  He is married and lives with his wife, he works as a Clinical research associate.  He has 2 grown children, he is a non-smoker and drinks alcohol occasionally or rarely, maybe once a month.  He drinks caffeine in the form of 1 cup of coffee about 3-4 times a week.  Reports low back pain which causes discomfort at night, sometimes he has paresthesias in his feet and reports paresthesias currently in his feet.  He reports that he sometimes gets up at night and sleeps in a different bedroom because of discomfort.  He takes gabapentin 900 mg 3 times a day.  Of note, he had his feet up on a chair today at the beginning of the visit, I did ask him to put his shoes back on for this visit as he did not need to get examined for his feet for this visit.  He was agreeable to keeping his shoes on and his feet off the chair.  He has had occasional morning headaches, not severe, sometimes he has headaches in the afternoons.  He does not always feel rested.  He has nocturia about once per average  night.  Bedtime is generally around 10:30 PM and rise time around 6:30 AM.  I reviewed your office note from 03/20/2020.  His retina specialist has encouraged him to get tested for sleep apnea due to disease of the macular on his right eye.  He is status post vitrectomy on the right. ? ? ?His Past Medical History Is Significant For: ?Past Medical History:  ?Diagnosis Date  ? Spinal stenosis   ? ? ?His Past Surgical History Is Significant For: ?Past Surgical History:  ?Procedure Laterality Date  ? BASAL CELL CARCINOMA EXCISION    ? 04/2021 scalp  ? ? ?His Family History Is Significant For: ?Family History  ?Problem Relation Age of Onset  ? Glaucoma Mother   ? Macular degeneration Mother   ? Glaucoma Maternal Grandfather   ? Brain cancer Maternal Grandfather   ? ? ?His Social History Is Significant For: ?Social History  ? ?Socioeconomic History  ? Marital status: Married  ?  Spouse name: Not on file  ? Number of children: Not on file  ? Years of education: Not on file  ? Highest education level: Not on file  ?Occupational History  ? Not on file  ?Tobacco Use  ? Smoking status: Never  ? Smokeless tobacco: Never  ?Vaping Use  ? Vaping Use: Never used  ?Substance and Sexual Activity  ? Alcohol use:  Yes  ?  Alcohol/week: 1.0 standard drink  ?  Types: 1 Glasses of wine per week  ?  Comment: once every other week  ? Drug use: Not Currently  ?  Comment: 1980's Marijuana  ? Sexual activity: Not on file  ?Other Topics Concern  ? Not on file  ?Social History Narrative  ? Caffiene 1-2 cups 3-4 x week.  Lawyer for [employer].    ? ?Social Determinants of Health  ? ?Financial Resource Strain: Not on file  ?Food Insecurity: Not on file  ?Transportation Needs: Not on file  ?Physical Activity: Not on file  ?Stress: Not on file  ?Social Connections: Not on file  ? ? ?His Allergies Are:  ?No Known Allergies:  ? ?His Current Medications Are:  ?Outpatient Encounter Medications as of 06/26/2021  ?Medication Sig  ? gabapentin (NEURONTIN)  300 MG capsule TAKE 3 CAPSULES(900 MG) BY MOUTH THREE TIMES DAILY  ? ?No facility-administered encounter medications on file as of 06/26/2021.  ?: ? ? ?Review of Systems:  ?Out of a complete 14 point review of systems, all are reviewed and negative with the exception of these symptoms as listed below: ? ?Review of Systems  ?Neurological:   ?     No symptoms, pcp recommends SS.   ESS 2, FSS 9.  ? ?Objective:  ?Neurological Exam ? ?Physical Exam ?Physical Examination:  ? ?Vitals:  ? 06/26/21 1251  ?BP: 110/67  ?Pulse: 66  ? ? ?General Examination: The patient is a very pleasant 66 y.o. male in no acute distress. He appears well-developed and well-nourished and well groomed.  ? ?HEENT: Normocephalic, atraumatic, pupils are equal, round and reactive to light, extraocular tracking is good without limitation to gaze excursion or nystagmus noted.  Corrective eyeglasses in place.  Hearing is grossly intact. Face is symmetric with normal facial animation. Speech is clear with no dysarthria noted. There is no hypophonia. There is no lip, neck/head, jaw or voice tremor. Neck is supple with full range of passive and active motion. There are no carotid bruits on auscultation. Oropharynx exam reveals: mild mouth dryness, adequate dental hygiene.  Mallampati class I, small uvula, tonsils absent.  Neck circumference of 15-1/2 inches, minimal overbite.  Tongue protrudes centrally and palate elevates symmetrically.  ? ?Chest: Clear to auscultation without wheezing, rhonchi or crackles noted. ? ?Heart: S1+S2+0, regular and normal without murmurs, rubs or gallops noted.  ? ?Abdomen: Soft, non-tender and non-distended. ? ?Extremities: There is no obvious edema in the distal lower extremities bilaterally.   ? ?Skin: Warm and dry without trophic changes noted.  ? ?Musculoskeletal: exam reveals no obvious joint deformities.  ? ?Neurologically:  ?Mental status: The patient is awake, alert and oriented in all 4 spheres. His immediate and  remote memory, attention, language skills and fund of knowledge are appropriate. There is no evidence of aphasia, agnosia, apraxia or anomia. Speech is clear with normal prosody and enunciation. Thought process is linear. Mood is normal and affect is normal.  ?Cranial nerves II - XII are as described above under HEENT exam.  ?Motor exam: Normal bulk, strength and tone is noted. There is no tremor. Fine motor skills and coordination: grossly intact.  ?Cerebellar testing: No dysmetria or intention tremor. There is no truncal or gait ataxia.  ?Sensory exam: intact to light touch in the upper and lower extremities.  ?Gait, station and balance: He stands easily. No veering to one side is noted. No leaning to one side is noted. Posture is age-appropriate  and stance is narrow based. Gait shows normal stride length and normal pace. No problems turning are noted.  No limp, no walking aid. ? ?Assessment and Plan:  ? ?In summary, Ryan Fox is a very pleasant 66 y.o.-year old male with an underlying medical history of lumbar spinal stenosis, cataracts, macular puckering, and mildly overweight state, whose history and physical exam are concerning for obstructive sleep apnea (OSA). ?I had a long chat with the patient about my findings and the diagnosis of OSA, its prognosis and treatment options. We talked about medical treatments, surgical interventions and non-pharmacological approaches. I explained in particular the risks and ramifications of untreated moderate to severe OSA, especially with respect to developing cardiovascular disease down the Road, including congestive heart failure, difficult to treat hypertension, cardiac arrhythmias, or stroke. Even type 2 diabetes has, in part, been linked to untreated OSA. Symptoms of untreated OSA include daytime sleepiness, memory problems, mood irritability and mood disorder such as depression and anxiety, lack of energy, as well as recurrent headaches, especially morning  headaches. We talked about trying to maintain a healthy lifestyle in general, as well as the importance of weight control. ?I recommended the following at this time: sleep study.  I outlined the differences between a l

## 2021-06-26 NOTE — Patient Instructions (Signed)

## 2021-07-23 ENCOUNTER — Ambulatory Visit (INDEPENDENT_AMBULATORY_CARE_PROVIDER_SITE_OTHER): Payer: 59 | Admitting: Neurology

## 2021-07-23 DIAGNOSIS — R519 Headache, unspecified: Secondary | ICD-10-CM

## 2021-07-23 DIAGNOSIS — E663 Overweight: Secondary | ICD-10-CM

## 2021-07-23 DIAGNOSIS — H35371 Puckering of macula, right eye: Secondary | ICD-10-CM

## 2021-07-23 DIAGNOSIS — G478 Other sleep disorders: Secondary | ICD-10-CM

## 2021-07-23 DIAGNOSIS — R351 Nocturia: Secondary | ICD-10-CM

## 2021-07-23 DIAGNOSIS — G4733 Obstructive sleep apnea (adult) (pediatric): Secondary | ICD-10-CM | POA: Diagnosis not present

## 2021-07-25 NOTE — Progress Notes (Signed)
See procedure note.

## 2021-07-26 NOTE — Procedures (Signed)
? ?  [facility] ? ?HOME SLEEP TEST (Watch PAT) REPORT ? ?STUDY DATE: 07/23/2021 ? ?DOB: [birth year] ? ?MRN: [id number] ? ?ORDERING CLINICIAN: Huston Foley, MD, PhD ?  ?REFERRING CLINICIAN: Creola Corn, MD  ? ?CLINICAL INFORMATION/HISTORY: 66 year old right-handed gentleman with an underlying medical history of lumbar spinal stenosis, cataracts, macular puckering, and mildly overweight state, who reports that he was advised to seek evaluation for sleep apnea by his retina specialist recently.  He denies any significant snoring or daytime somnolence.   ? ?Epworth sleepiness score: 2/24. ? ?BMI: 25.8 kg/m? ? ?FINDINGS:  ? ?Sleep Summary:  ? ?Total Recording Time (hours, min): 7 hours, 22 minutes ? ?Total Sleep Time (hours, min):  5 hours, 59 minutes  ? ?Percent REM (%):    15.7%  ? ?Respiratory Indices:  ? ?Calculated pAHI (per hour):  5.2/hour        ? ?REM pAHI:    12.4/hour      ? ?NREM pAHI: 3.9/hour ? ?Oxygen Saturation Statistics:  ?  ?Oxygen Saturation (%) Mean: 95%  ? ?Minimum oxygen saturation (%):                 89%  ? ?O2 Saturation Range (%): 89-99%   ? ?O2 Saturation (minutes) <=88%: 0 min ? ?Pulse Rate Statistics:  ? ?Pulse Mean (bpm):    53/min   ? ?Pulse Range (37-132/min)  ? ?IMPRESSION: OSA (obstructive sleep apnea), mild/borderline ? ?RECOMMENDATION:  ?This HST shows borderline obstructive sleep apnea, with an AHI of 5.2/hour and O2 nadir of 89%.  Intermittent mild to moderate snoring was detected. Treatment with positive airway pressure can be considered with autoPAP, if desired by patient. Treatment options otherwise include some weight loss and avoidance of the supine sleep position or a dental device. These different avenues will be discussed with the patient. The patient will be seen in follow up in sleep clinic, if necessary. Please note, that other causes of the patient's symptoms, including circadian rhythm disturbances, an underlying mood disorder, medication effect and/or an  underlying medical problem cannot be ruled out based on this test. Clinical correlation is recommended. The patient should be cautioned not to drive, work at heights, or operate dangerous or heavy equipment when tired or sleepy. Review and reiteration of good sleep hygiene measures should be pursued with any patient. The referring provider will be notified of the test results.  ? ?I certify that I have reviewed the raw data recording prior to the issuance of this report in accordance with the standards of the American Academy of Sleep Medicine (AASM).  ? ?INTERPRETING PHYSICIAN:  ? ?Huston Foley, MD, PhD  ?Board Certified in Neurology and Sleep Medicine ? ?[facility] ?[address], [address] ?[place], [place] [address] ?[phone] ? ? ? ? ? ? ? ? ? ? ? ? ? ? ? ? ? ? ?

## 2021-07-30 ENCOUNTER — Telehealth: Payer: Self-pay | Admitting: *Deleted

## 2021-07-30 ENCOUNTER — Encounter: Payer: Self-pay | Admitting: *Deleted

## 2021-07-30 NOTE — Telephone Encounter (Signed)
Sent Dr.Russo  sleep results and  sent Mychart message  to patient per Dr Frances Furbish can FU with PCP and Dr.Athar has needed  ?

## 2021-07-30 NOTE — Telephone Encounter (Signed)
Noted, and thank you for reaching out to patient. Please make sure Dr. Timothy Lasso has a copy of the sleep study. He can FU with PCP and with me as needed. Nothing further needed.  ?

## 2021-07-30 NOTE — Telephone Encounter (Signed)
-----   Message from Huston Foley, MD sent at 07/26/2021  6:08 PM EDT ----- ?Patient was referred by primary care at the recommendation of patient's eye doctor for sleep apnea evaluation.  I saw him on 06/26/2021 and he had a home sleep test on 07/23/2021.  Please advise patient that his home sleep test indicated rather mild or borderline sleep apnea.  Treatment is optional with an AutoPap machine but I would be happy to prescribe a machine for him for home use.  I am not sure if the insurance will cover treatment at this mild level but we can try.  Other treatment options may include a dental device through a dentist or orthodontist.  If he would like to explore this option, I would be happy to make a referral to a dentist of his choosing or we can help facilitate a referral to a practice that we often work with.  Mild weight loss and avoiding sleeping on the back may also be a treatment option for his overall mild sleep apnea.  Please let me know if he would like to proceed with AutoPap therapy or would like a referral to dentistry. ?

## 2021-07-30 NOTE — Telephone Encounter (Signed)
Spoke to patient rushing me off the phone before I can speak to him about sleep study results. Asked patient if he wanted to call him back tommrow to discuss results.Pt states no he is about to go on vacation tonight. Asked patient did he want to call back after vacation and he said no . Pt states already read results and he didn't want to move on with any treatment . Pt states he was ok . Informed patient he may be in higher risk of hypertension , heart attack, and stroke. Pt states that's ok and hung up the phone. Will make Dr Rexene Alberts aware .  ?

## 2021-11-08 ENCOUNTER — Encounter (INDEPENDENT_AMBULATORY_CARE_PROVIDER_SITE_OTHER): Payer: 59 | Admitting: Ophthalmology

## 2021-11-21 ENCOUNTER — Ambulatory Visit (INDEPENDENT_AMBULATORY_CARE_PROVIDER_SITE_OTHER): Payer: 59 | Admitting: Ophthalmology

## 2021-11-21 ENCOUNTER — Encounter (INDEPENDENT_AMBULATORY_CARE_PROVIDER_SITE_OTHER): Payer: Self-pay | Admitting: Ophthalmology

## 2021-11-21 DIAGNOSIS — H35371 Puckering of macula, right eye: Secondary | ICD-10-CM

## 2021-11-21 DIAGNOSIS — H2513 Age-related nuclear cataract, bilateral: Secondary | ICD-10-CM | POA: Diagnosis not present

## 2021-11-21 NOTE — Assessment & Plan Note (Signed)
OD looks great, with restoration of outer retinal photoreceptor layer post vitrectomy membrane peel right eye.  Minor thickening of the underlying retina remains.  Visual acuity likely limited but by early cataract which does not appear clinically impressive however central nuclear sclerosis out of proportion to clear lens cortical lens fibers makes for a oil droplet cataract which can be highly visually significant

## 2021-11-21 NOTE — Progress Notes (Signed)
11/21/2021     CHIEF COMPLAINT Patient presents for  Chief Complaint  Patient presents with   Retina Follow Up    Vitrectomy membrane peel right eye for epiretinal membrane  HISTORY OF PRESENT ILLNESS: Ryan Fox is a 66 y.o. male who presents to the clinic today for:   HPI     Retina Follow Up           Diagnosis: Other   Laterality: right eye         Comments   6 MOS for DILATE OCT. Pt stated, "There are still some distortion in my right eye where he did surgery on. I could see bent lines on the horizontal of my vision." Pt denies new floaters and FOL. Pt reports left eye remained stable.       Last edited by Silvestre Moment on 11/21/2021  8:09 AM.      Referring physician: Sharyne Peach, MD [address] [place],   [address]  HISTORICAL INFORMATION:   Selected notes from the MEDICAL RECORD NUMBER       CURRENT MEDICATIONS: No current outpatient medications on file. (Ophthalmic Drugs)   No current facility-administered medications for this visit. (Ophthalmic Drugs)   Current Outpatient Medications (Other)  Medication Sig   gabapentin (NEURONTIN) 300 MG capsule TAKE 3 CAPSULES(900 MG) BY MOUTH THREE TIMES DAILY   No current facility-administered medications for this visit. (Other)      REVIEW OF SYSTEMS: ROS   Negative for: Constitutional, Gastrointestinal, Neurological, Skin, Genitourinary, Musculoskeletal, HENT, Endocrine, Cardiovascular, Eyes, Respiratory, Psychiatric, Allergic/Imm, Heme/Lymph Last edited by Silvestre Moment on 11/21/2021  8:09 AM.       ALLERGIES No Known Allergies  PAST MEDICAL HISTORY Past Medical History:  Diagnosis Date   Spinal stenosis    Past Surgical History:  Procedure Laterality Date   BASAL CELL CARCINOMA EXCISION     04/2021 scalp   OTHER SURGICAL HISTORY     retina membrane peel 03-21-2021 Dr. Zadie Rhine    FAMILY HISTORY Family History  Problem Relation Age of Onset   Glaucoma Mother    Macular degeneration  Mother    Glaucoma Maternal Grandfather    Brain cancer Maternal Grandfather     SOCIAL HISTORY Social History   Tobacco Use   Smoking status: Never   Smokeless tobacco: Never  Vaping Use   Vaping Use: Never used  Substance Use Topics   Alcohol use: Yes    Alcohol/week: 1.0 standard drink of alcohol    Types: 1 Glasses of wine per week    Comment: once every other week   Drug use: Not Currently    Comment: 1980's Marijuana         OPHTHALMIC EXAM:  Base Eye Exam     Visual Acuity (ETDRS)       Right Left   Dist cc 20/25 -1 +2 20/15    Correction: Glasses         Tonometry (Tonopen, 8:14 AM)       Right Left   Pressure 14 16         Pupils       Pupils Dark Light Shape React APD   Right PERRL 4 3 Round Brisk None   Left PERRL 4 3 Round Brisk None         Visual Fields       Left Right    Full Full         Extraocular Movement  Right Left    Full, Ortho Full, Ortho         Neuro/Psych     Oriented x3: Yes   Mood/Affect: Normal         Dilation     Both eyes: 1.0% Mydriacyl, 2.5% Phenylephrine @ 8:14 AM           Slit Lamp and Fundus Exam     External Exam       Right Left   External Normal Normal         Slit Lamp Exam       Right Left   Lids/Lashes Normal Normal   Conjunctiva/Sclera White and quiet White and quiet   Cornea Clear Clear   Anterior Chamber Deep and quiet Deep and quiet   Iris Round and reactive Round and reactive   Lens 2.5+ Nuclear sclerosis, Early Central oil droplet cataract seen post vitrectomy 1.5+ Nuclear sclerosis   Anterior Vitreous Normal Normal         Fundus Exam       Right Left   Posterior Vitreous Clear, avitric Normal   Disc Normal Normal   C/D Ratio 0.1 0.15   Macula No topographic distortion, no ILM striae, post ILM removal appearance Normal   Vessels Normal Normal   Periphery Normal Normal            IMAGING AND PROCEDURES  Imaging and Procedures for  11/21/21  OCT, Retina - OU - Both Eyes       Right Eye Quality was good. Central Foveal Thickness: 405. Progression has improved. Findings include abnormal foveal contour.   Left Eye Quality was good. Central Foveal Thickness: 302. Progression has been stable. Findings include normal foveal contour.   Notes OD vitrectomized, much less topographic distortion.  Outer retinal photoreceptor layer nodular disturbance noted secondary to epiretinal membrane has now completely resolved with restoration of outer retinal anatomy OD.  OS normal anatomy, incidental PVD              ASSESSMENT/PLAN:  Macular puckering, right eye OD looks great, with restoration of outer retinal photoreceptor layer post vitrectomy membrane peel right eye.  Minor thickening of the underlying retina remains.  Visual acuity likely limited but by early cataract which does not appear clinically impressive however central nuclear sclerosis out of proportion to clear lens cortical lens fibers makes for a oil droplet cataract which can be highly visually significant  Nuclear sclerotic cataract of both eyes OS minor  OD, oil droplet cataract typical post vitrectomy changes.  While not clinically significant at slit-lamp examination, tends to have refractive errors induced because of the refractive changes of the central nucleus compared to the outer clear cortical elements of the lens  Visual symptoms at 20/25 in the right eye most likely from this cataract progression OD       ICD-10-CM   1. Macular puckering, right eye  H35.371 OCT, Retina - OU - Both Eyes    2. Nuclear sclerotic cataract of both eyes  H25.13       1.  OD looks great, retina attached.  Macula with less topographic distortion some 9 months post vitrectomy membrane peel.  2.  Nuclear sclerosis slowly progressive in the right eye in the vitrectomized eye likely inducing refractive error but also likely accounts for the acuity disparity  currently present.  3.  Follow-up [facility] and/or Dr. Elise Benne  Ophthalmic Meds Ordered this visit:  No orders of the defined types  were placed in this encounter.      Return if symptoms worsen or fail to improve.  There are no Patient Instructions on file for this visit.   Explained the diagnoses, plan, and follow up with the patient and they expressed understanding.  Patient expressed understanding of the importance of proper follow up care.   Alford Highland Alexee Delsanto M.D. Diseases & Surgery of the Retina and Vitreous [facility] 11/21/21     Abbreviations: M myopia (nearsighted); A astigmatism; H hyperopia (farsighted); P presbyopia; Mrx spectacle prescription;  CTL contact lenses; OD right eye; OS left eye; OU both eyes  XT exotropia; ET esotropia; PEK punctate epithelial keratitis; PEE punctate epithelial erosions; DES dry eye syndrome; MGD meibomian gland dysfunction; ATs artificial tears; PFAT's preservative free artificial tears; NSC nuclear sclerotic cataract; PSC posterior subcapsular cataract; ERM epi-retinal membrane; PVD posterior vitreous detachment; RD retinal detachment; DM diabetes mellitus; DR diabetic retinopathy; NPDR non-proliferative diabetic retinopathy; PDR proliferative diabetic retinopathy; CSME clinically significant macular edema; DME diabetic macular edema; dbh dot blot hemorrhages; CWS cotton wool spot; POAG primary open angle glaucoma; C/D cup-to-disc ratio; HVF humphrey visual field; GVF goldmann visual field; OCT optical coherence tomography; IOP intraocular pressure; BRVO Branch retinal vein occlusion; CRVO central retinal vein occlusion; CRAO central retinal artery occlusion; BRAO branch retinal artery occlusion; RT retinal tear; SB scleral buckle; PPV pars plana vitrectomy; VH Vitreous hemorrhage; PRP panretinal laser photocoagulation; IVK intravitreal kenalog; VMT vitreomacular traction; MH Macular hole;  NVD  neovascularization of the disc; NVE neovascularization elsewhere; AREDS age related eye disease study; ARMD age related macular degeneration; POAG primary open angle glaucoma; EBMD epithelial/anterior basement membrane dystrophy; ACIOL anterior chamber intraocular lens; IOL intraocular lens; PCIOL posterior chamber intraocular lens; Phaco/IOL phacoemulsification with intraocular lens placement; PRK photorefractive keratectomy; LASIK laser assisted in situ keratomileusis; HTN hypertension; DM diabetes mellitus; COPD chronic obstructive pulmonary disease

## 2021-11-21 NOTE — Assessment & Plan Note (Signed)
OS minor  OD, oil droplet cataract typical post vitrectomy changes.  While not clinically significant at slit-lamp examination, tends to have refractive errors induced because of the refractive changes of the central nucleus compared to the outer clear cortical elements of the lens  Visual symptoms at 20/25 in the right eye most likely from this cataract progression OD

## 2022-01-13 ENCOUNTER — Other Ambulatory Visit: Payer: Self-pay | Admitting: Sports Medicine

## 2022-01-13 DIAGNOSIS — R208 Other disturbances of skin sensation: Secondary | ICD-10-CM

## 2022-01-15 ENCOUNTER — Other Ambulatory Visit: Payer: Self-pay | Admitting: *Deleted

## 2022-01-15 DIAGNOSIS — R208 Other disturbances of skin sensation: Secondary | ICD-10-CM

## 2022-01-15 MED ORDER — GABAPENTIN 300 MG PO CAPS: ORAL_CAPSULE | ORAL | 6 refills | Status: DC

## 2022-06-03 DIAGNOSIS — L814 Other melanin hyperpigmentation: Secondary | ICD-10-CM | POA: Diagnosis not present

## 2022-06-03 DIAGNOSIS — L111 Transient acantholytic dermatosis [Grover]: Secondary | ICD-10-CM | POA: Diagnosis not present

## 2022-06-03 DIAGNOSIS — L821 Other seborrheic keratosis: Secondary | ICD-10-CM | POA: Diagnosis not present

## 2022-06-03 DIAGNOSIS — B353 Tinea pedis: Secondary | ICD-10-CM | POA: Diagnosis not present

## 2022-07-31 DIAGNOSIS — R7989 Other specified abnormal findings of blood chemistry: Secondary | ICD-10-CM | POA: Diagnosis not present

## 2022-07-31 DIAGNOSIS — E039 Hypothyroidism, unspecified: Secondary | ICD-10-CM | POA: Diagnosis not present

## 2022-07-31 DIAGNOSIS — Z125 Encounter for screening for malignant neoplasm of prostate: Secondary | ICD-10-CM | POA: Diagnosis not present

## 2022-07-31 DIAGNOSIS — Z1212 Encounter for screening for malignant neoplasm of rectum: Secondary | ICD-10-CM | POA: Diagnosis not present

## 2022-07-31 DIAGNOSIS — Z79899 Other long term (current) drug therapy: Secondary | ICD-10-CM | POA: Diagnosis not present

## 2022-07-31 DIAGNOSIS — F419 Anxiety disorder, unspecified: Secondary | ICD-10-CM | POA: Diagnosis not present

## 2022-07-31 DIAGNOSIS — R739 Hyperglycemia, unspecified: Secondary | ICD-10-CM | POA: Diagnosis not present

## 2022-07-31 LAB — HEMOGLOBIN A1C: A1c: 5.9

## 2022-08-01 ENCOUNTER — Ambulatory Visit (INDEPENDENT_AMBULATORY_CARE_PROVIDER_SITE_OTHER): Payer: BC Managed Care – PPO | Admitting: Sports Medicine

## 2022-08-01 VITALS — BP 130/80 | Ht 75.5 in | Wt 200.0 lb

## 2022-08-01 DIAGNOSIS — M545 Low back pain, unspecified: Secondary | ICD-10-CM

## 2022-08-01 DIAGNOSIS — G8929 Other chronic pain: Secondary | ICD-10-CM

## 2022-08-01 DIAGNOSIS — M48061 Spinal stenosis, lumbar region without neurogenic claudication: Secondary | ICD-10-CM | POA: Diagnosis not present

## 2022-08-01 MED ORDER — PREDNISONE 20 MG PO TABS: 20.0000 mg | ORAL_TABLET | Freq: Two times a day (BID) | ORAL | 0 refills | Status: DC

## 2022-08-02 ENCOUNTER — Encounter: Payer: Self-pay | Admitting: Sports Medicine

## 2022-08-02 NOTE — Assessment & Plan Note (Signed)
Patient has significant pain now for more than 2 months I believe we need to repeat his MRI because I am concerned that he may have herniated a disc We will start prednisone 20 twice daily for the next week He has been on gabapentin since his original diagnosis in 2020 and that has controlled a great deal of his back pain and blocked radicular symptoms up to time  Pending the results of his MRI he may require neurosurgical referral

## 2022-08-02 NOTE — Progress Notes (Signed)
Chief complaint: Significant flare of low back pain since February  Patient has known degenerative disc disease of his lumbar spine with an element of spinal stenosis This was documented on MRI in 2020 He is very active and athletic playing tennis regularly However he did something perhaps lifting that bothered his back a bit in February Then while sitting leaning forward at his desk he developed severe back pain He was able to flex forward He would have to use his hands to support him when he would sit down The pain extended across his low back but did not seem to radiate in a sciatic pattern He does not think he is experienced any weakness More than 2 months later he still having trouble bending forward without significant pain  Physical exam Pleasant white male who looks uncomfortable seated BP 130/80   Ht 6' 3.5" (1.918 m)   Wt 200 lb (90.7 kg)   BMI 24.67 kg/m   Examination shows pain with only 15 degrees of forward band Extension does not seem painful Lateral bend does not seem painful He seems to have good strength with ability to walk on his toes He can walk on his heels He can do crossover walk He has good balance Reflexes are preserved

## 2022-08-05 ENCOUNTER — Encounter: Payer: Self-pay | Admitting: Sports Medicine

## 2022-08-06 ENCOUNTER — Ambulatory Visit
Admission: RE | Admit: 2022-08-06 | Discharge: 2022-08-06 | Disposition: A | Discharge status: Home / Self Care | Payer: BC Managed Care – PPO | Source: Ambulatory Visit | Attending: Sports Medicine | Admitting: Sports Medicine

## 2022-08-06 DIAGNOSIS — M545 Low back pain, unspecified: Secondary | ICD-10-CM | POA: Diagnosis not present

## 2022-08-06 DIAGNOSIS — M5416 Radiculopathy, lumbar region: Secondary | ICD-10-CM | POA: Diagnosis not present

## 2022-08-06 DIAGNOSIS — R739 Hyperglycemia, unspecified: Secondary | ICD-10-CM | POA: Diagnosis not present

## 2022-08-06 DIAGNOSIS — Z1212 Encounter for screening for malignant neoplasm of rectum: Secondary | ICD-10-CM | POA: Diagnosis not present

## 2022-08-06 DIAGNOSIS — Z1339 Encounter for screening examination for other mental health and behavioral disorders: Secondary | ICD-10-CM | POA: Diagnosis not present

## 2022-08-06 DIAGNOSIS — R209 Unspecified disturbances of skin sensation: Secondary | ICD-10-CM | POA: Diagnosis not present

## 2022-08-06 DIAGNOSIS — Z1331 Encounter for screening for depression: Secondary | ICD-10-CM | POA: Diagnosis not present

## 2022-08-06 DIAGNOSIS — Z0000 Encounter for general adult medical examination without abnormal findings: Secondary | ICD-10-CM | POA: Diagnosis not present

## 2022-08-06 DIAGNOSIS — F5221 Male erectile disorder: Secondary | ICD-10-CM | POA: Diagnosis not present

## 2022-08-06 DIAGNOSIS — R82998 Other abnormal findings in urine: Secondary | ICD-10-CM | POA: Diagnosis not present

## 2022-08-13 ENCOUNTER — Ambulatory Visit (INDEPENDENT_AMBULATORY_CARE_PROVIDER_SITE_OTHER): Payer: BC Managed Care – PPO | Admitting: Sports Medicine

## 2022-08-13 ENCOUNTER — Other Ambulatory Visit: Payer: Self-pay | Admitting: Sports Medicine

## 2022-08-13 VITALS — Ht 75.5 in | Wt 200.0 lb

## 2022-08-13 DIAGNOSIS — M48061 Spinal stenosis, lumbar region without neurogenic claudication: Secondary | ICD-10-CM | POA: Diagnosis not present

## 2022-08-13 DIAGNOSIS — M899 Disorder of bone, unspecified: Secondary | ICD-10-CM | POA: Diagnosis not present

## 2022-08-13 NOTE — Assessment & Plan Note (Signed)
I discussed with the patient that the radiologist concern is valid based on the change on the MRI However I reviewed his complete labs taken by Dr. Timothy Lasso and he does not have anemia; he has a normal calcium level; normal protein level and no other laboratory findings of significance. The lesion in the L1 vertebrae is not lytic or erosive and based on his laboratory his and his clinical presentation I think the risk of this being multiple myeloma are very rare However, he would like to do at least the baseline screening with lab work We will get a serum protein electrophoresis with immunologic analysis A free light chain serum test A sed rate and a CRP A quantitative immunoglobulin test  Should all of these be normal I suggested he could be followed with a 84-month MRI and then yearly to be sure that this is not a progressive lesion  He should keep his appointment with the neurosurgeon to see if we need to be more aggressive about his spinal stenosis He is reluctant to consider surgery but if his symptoms limit his lifestyle too much he would like to understand that option

## 2022-08-13 NOTE — Progress Notes (Signed)
Patient comes in for follow-up of low back pain and MRI results  His low back pain has improved considerably since I gave him the prednisone series for 1 week He is walking and doing normal activities without pain He states that sometimes a quick movement may trigger a brief pain He is not currently having sciatica but he does continue on his gabapentin  MRI results are reviewed with the patient I have concern is that he had a hyperdense appearing lesion in the L1 vertebral body This is rounded and looks benign However the size of this is doubled since his MRI in 2020 The radiologist is concerned that we should rule out multiple myeloma  Other results of the MRI do indicate some worsening of the degree of spinal stenosis and disc bulging at L3-4 and L4-5 that may be impinging on his nerve roots  Family history is that his father had back problems starting with disc problems in his lumbar spine but ultimately involving much of his entire spine His father had multiple surgeries

## 2022-08-14 LAB — PROTEIN ELECTROPHORESIS, SERUM, WITH REFLEX

## 2022-08-14 LAB — KAPPA/LAMBDA LIGHT CHAINS

## 2022-08-15 LAB — PROTEIN ELECTROPHORESIS, SERUM, WITH REFLEX

## 2022-08-15 LAB — IGG, IGA, IGM
IgA/Immunoglobulin A, Serum: 131 mg/dL (ref 61–437)
IgM (Immunoglobulin M), Srm: 346 mg/dL — ABNORMAL HIGH (ref 20–172)

## 2022-08-15 LAB — KAPPA/LAMBDA LIGHT CHAINS: Ig Lambda Free Light Chain: 11.7 mg/L (ref 5.7–26.3)

## 2022-08-17 LAB — SEDIMENTATION RATE: Sed Rate: 2 mm/hr (ref 0–30)

## 2022-08-17 LAB — KAPPA/LAMBDA LIGHT CHAINS: KAPPA/LAMBDA RATIO: 1.15 (ref 0.26–1.65)

## 2022-08-17 LAB — PROTEIN ELECTROPHORESIS, SERUM, WITH REFLEX

## 2022-08-17 LAB — C-REACTIVE PROTEIN: CRP: <1 mg/L (ref 0–10)

## 2022-08-21 LAB — PROTEIN ELECTROPHORESIS, SERUM, WITH REFLEX: Interpretation(See Below): 0

## 2022-08-21 LAB — IMMUNOFIXATION REFLEX, SERUM

## 2022-08-21 LAB — IGG, IGA, IGM: IgG (Immunoglobin G), Serum: 803 mg/dL (ref 603–1613)

## 2022-08-23 DIAGNOSIS — H6123 Impacted cerumen, bilateral: Secondary | ICD-10-CM | POA: Diagnosis not present

## 2022-08-26 DIAGNOSIS — H6123 Impacted cerumen, bilateral: Secondary | ICD-10-CM | POA: Diagnosis not present

## 2022-08-26 DIAGNOSIS — H9193 Unspecified hearing loss, bilateral: Secondary | ICD-10-CM | POA: Diagnosis not present

## 2022-09-11 DIAGNOSIS — Z6825 Body mass index (BMI) 25.0-25.9, adult: Secondary | ICD-10-CM | POA: Diagnosis not present

## 2022-09-11 DIAGNOSIS — M5136 Other intervertebral disc degeneration, lumbar region: Secondary | ICD-10-CM | POA: Diagnosis not present

## 2022-09-26 ENCOUNTER — Inpatient Hospital Stay: Payer: BC Managed Care – PPO

## 2022-09-26 ENCOUNTER — Encounter: Payer: Self-pay | Admitting: Oncology

## 2022-09-26 ENCOUNTER — Inpatient Hospital Stay: Payer: BC Managed Care – PPO | Attending: Oncology | Admitting: Oncology

## 2022-09-26 VITALS — BP 105/71 | HR 82 | Temp 98.2°F | Resp 18 | Ht 75.0 in | Wt 205.7 lb

## 2022-09-26 DIAGNOSIS — D472 Monoclonal gammopathy: Secondary | ICD-10-CM

## 2022-09-26 NOTE — Progress Notes (Signed)
[facility] New Patient Consult   Requesting MD: Creola Corn, Md [address] [place],  [place] [address]   Ryan Fox 67 y.o.  Mar 20, 1956    Reason for Consult: Serum monoclonal protein   HPI: Ryan Fox has a history of spinal stenosis.  He had a flare of low back pain beginning in February.  He saw Dr. Darrick Penna.  The pain resolved after a course of prednisone. An MRI of the lumbar spine 08/06/2022 feels unchanged hemangiomas at S1 and S2.  There is increased size of 2 small hyperintense foci in the L1 vertebral body, largest measuring 1.3 cm.  The MRI was compared to a study from November 2020.  There is progressive moderate spinal and left neuroforaminal stenosis at L4-5.  A serum protein electrophoresis on 08/13/2022 revealed a 0.3 g/dL M spike with an immunofixation confirming an IgM lambda monoclonal protein.  The IgM level returned at 346, IgG 803, and IgA 131.  Kappa and lambda light chains were normal.  He reports that he saw Dr. Danielle Dess and no treatment was recommended for the back pain or spinal stenosis.  He saw Dr. Timothy Lasso.  Labs from 07/31/2022 revealed a creatinine of 1.1, calcium 9.4, total protein 6.5, albumin 3.6, and alkaline phosphatase 64.  The hemoglobin was measured at 15.8, MCV 89, white count 5.5, platelets 195,000, and ANC 3.2.  The absolute lymphocyte count returned at 1.2.   Past Medical History:  Diagnosis Date   Spinal stenosis    Spinal stenosis, lumbar region, without neurogenic claudication 06/04/2016   XRay 06/04/2016 is normal    .  "Neuropathy "in the feet  Past Surgical History:  Procedure Laterality Date   BASAL CELL CARCINOMA EXCISION     04/2021 scalp   OTHER SURGICAL HISTORY     retina membrane peel 03-21-2021 Dr. Luciana Axe    .   Bilateral knee meniscus surgery   .   Right cataract    .   Salivary gland stone removal in 2001   .  History of colon polyps  Medications: Reviewed  Allergies: No Known Allergies  Family history: A  brother had Hodgkin's lymphoma.  His maternal grandfather had a brain tumor.  His father had esophagus cancer.  His mother had skin cancer.  Social History:   He lives with his wife in [place].  He is a logger.  He does not use cigarettes.  He has 3 liquor drinks per week.  No transfusion history.  No risk factor for HIV or hepatitis.  ROS:   Positives include: Numbness and tingling in the feet for years-worse at night and when wearing dress shoes, decreased vision in the right eye  A complete ROS was otherwise negative.  Physical Exam:  Blood pressure 105/71, pulse 82, temperature 98.2 F (36.8 C), temperature source Oral, resp. rate 18, height 6\' 3"  (1.905 m), weight 205 lb 11.2 oz (93.3 kg), SpO2 100 %.  HEENT: Oropharynx without visible mass, neck without mass Lungs: Clear bilaterally Cardiac: Regular rate and rhythm Abdomen: No hepatosplenomegaly nontender, no mass Vascular: No leg edema Lymph nodes: No cervical, supraclavicular, axillary, or inguinal nodes Neurologic: Alert and oriented, the motor exam appears intact in the upper and lower extremities bilaterally Skin: No rash Musculoskeletal: No spine tenderness   LAB: As per HPI  Imaging:  As per HPI, MRI images from 08/06/2022 reviewed with Ryan Fox   Assessment/Plan:   Serum monoclonal IgM lambda protein Serum M spike 0.3 g/dL 6/0/4540 Normal IgG and  IgA levels 08/13/2022 L1 foci of signal abnormality on MRI lumbar spine 08/06/2022, indeterminate-increased from an MRI in November 2020 Spinal stenosis at L4-5 and left neuroforaminal stenosis on MRI 08/06/2022 Chronic foot numbness/tingling History of colon polyps    Disposition:   Ryan Fox is referred evaluation of a low-level serum IgM lambda protein.  The protein most likely represents a monoclonal gammopathy of unknown significance.  He does not have clinical or laboratory evidence to suggest multiple myeloma or another lymphoproliferative disorder.  There  is no evidence of a systemic inflammatory condition such as rheumatoid arthritis or low-grade lymphoma which can be associated with serum IgM monoclonal proteins.  I doubt the indeterminate L1 lesions are related to the monoclonal protein or his recent episode of back pain.  He has chronic numbness/tingling in the feet.  This may be related to spinal stenosis or peripheral neuropathy.  I recommended a neurology referral to consider nerve conduction testing.  Ryan Fox is comfortable with observation.  He will return for an office visit, myeloma panel, erythrocyte sedimentation rate, and rheumatoid factor in 4 months.    Thornton Papas, MD  09/26/2022, 2:15 PM

## 2022-12-11 ENCOUNTER — Other Ambulatory Visit: Payer: Self-pay

## 2022-12-11 ENCOUNTER — Other Ambulatory Visit: Payer: Self-pay | Admitting: Sports Medicine

## 2022-12-11 DIAGNOSIS — R208 Other disturbances of skin sensation: Secondary | ICD-10-CM

## 2022-12-11 MED ORDER — GABAPENTIN 300 MG PO CAPS: ORAL_CAPSULE | ORAL | 6 refills | Status: DC

## 2023-01-15 DIAGNOSIS — L111 Transient acantholytic dermatosis [Grover]: Secondary | ICD-10-CM | POA: Diagnosis not present

## 2023-01-15 DIAGNOSIS — L821 Other seborrheic keratosis: Secondary | ICD-10-CM | POA: Diagnosis not present

## 2023-01-15 DIAGNOSIS — D235 Other benign neoplasm of skin of trunk: Secondary | ICD-10-CM | POA: Diagnosis not present

## 2023-01-15 DIAGNOSIS — L814 Other melanin hyperpigmentation: Secondary | ICD-10-CM | POA: Diagnosis not present

## 2023-01-16 ENCOUNTER — Inpatient Hospital Stay: Payer: BC Managed Care – PPO | Attending: Oncology

## 2023-01-16 DIAGNOSIS — R768 Other specified abnormal immunological findings in serum: Secondary | ICD-10-CM | POA: Diagnosis not present

## 2023-01-16 DIAGNOSIS — D472 Monoclonal gammopathy: Secondary | ICD-10-CM

## 2023-01-16 DIAGNOSIS — G629 Polyneuropathy, unspecified: Secondary | ICD-10-CM | POA: Diagnosis not present

## 2023-01-16 LAB — CMP (CANCER CENTER ONLY)
ALT: 9 U/L (ref 0–44)
AST: 13 U/L — ABNORMAL LOW (ref 15–41)
Albumin: 4.4 g/dL (ref 3.5–5.0)
Alkaline Phosphatase: 58 U/L (ref 38–126)
Anion gap: 6 (ref 5–15)
BUN: 18 mg/dL (ref 8–23)
CO2: 30 mmol/L (ref 22–32)
Calcium: 9.9 mg/dL (ref 8.9–10.3)
Chloride: 107 mmol/L (ref 98–111)
Creatinine: 1.08 mg/dL (ref 0.61–1.24)
GFR, Estimated: >60 mL/min (ref >60)
Glucose, Bld: 83 mg/dL (ref 70–99)
Potassium: 4.4 mmol/L (ref 3.5–5.1)
Sodium: 143 mmol/L (ref 135–145)
Total Bilirubin: 1 mg/dL (ref 0.3–1.2)
Total Protein: 6.9 g/dL (ref 6.5–8.1)

## 2023-01-16 LAB — CBC WITH DIFFERENTIAL (CANCER CENTER ONLY)
Abs Immature Granulocytes: 0.01 10*3/uL (ref 0.00–0.07)
Basophils Absolute: 0.1 10*3/uL (ref 0.0–0.1)
Basophils Relative: 1 %
Eosinophils Absolute: 0.4 10*3/uL (ref 0.0–0.5)
Eosinophils Relative: 7 %
HCT: 51.3 % (ref 39.0–52.0)
Hemoglobin: 16.7 g/dL (ref 13.0–17.0)
Immature Granulocytes: 0 %
Lymphocytes Relative: 17 %
Lymphs Abs: 1.1 10*3/uL (ref 0.7–4.0)
MCH: 29.8 pg (ref 26.0–34.0)
MCHC: 32.6 g/dL (ref 30.0–36.0)
MCV: 91.6 fL (ref 80.0–100.0)
Monocytes Absolute: 0.4 10*3/uL (ref 0.1–1.0)
Monocytes Relative: 7 %
Neutro Abs: 4.3 10*3/uL (ref 1.7–7.7)
Neutrophils Relative %: 68 %
Platelet Count: 199 10*3/uL (ref 150–400)
RBC: 5.6 MIL/uL (ref 4.22–5.81)
RDW: 13 % (ref 11.5–15.5)
WBC Count: 6.3 10*3/uL (ref 4.0–10.5)
nRBC: 0 % (ref 0.0–0.2)

## 2023-01-16 LAB — SEDIMENTATION RATE: Sed Rate: 3 mm/h (ref 0–16)

## 2023-01-17 LAB — RHEUMATOID FACTOR: Rheumatoid fact SerPl-aCnc: 15.2 [IU]/mL — ABNORMAL HIGH (ref <14.0)

## 2023-01-17 LAB — KAPPA/LAMBDA LIGHT CHAINS
Kappa free light chain: 14.3 mg/L (ref 3.3–19.4)
Kappa, lambda light chain ratio: 1.04 (ref 0.26–1.65)
Lambda free light chains: 13.7 mg/L (ref 5.7–26.3)

## 2023-01-20 LAB — MULTIPLE MYELOMA PANEL, SERUM
Albumin SerPl Elph-Mcnc: 4 g/dL (ref 2.9–4.4)
Albumin/Glob SerPl: 1.8 — ABNORMAL HIGH (ref 0.7–1.7)
Alpha 1: 0.2 g/dL (ref 0.0–0.4)
Alpha2 Glob SerPl Elph-Mcnc: 0.5 g/dL (ref 0.4–1.0)
B-Globulin SerPl Elph-Mcnc: 0.8 g/dL (ref 0.7–1.3)
Gamma Glob SerPl Elph-Mcnc: 0.8 g/dL (ref 0.4–1.8)
Globulin, Total: 2.3 g/dL (ref 2.2–3.9)
IgA: 135 mg/dL (ref 61–437)
IgG (Immunoglobin G), Serum: 783 mg/dL (ref 603–1613)
IgM (Immunoglobulin M), Srm: 343 mg/dL — ABNORMAL HIGH (ref 20–172)
M Protein SerPl Elph-Mcnc: 0.3 g/dL — ABNORMAL HIGH
Total Protein ELP: 6.3 g/dL (ref 6.0–8.5)

## 2023-01-23 ENCOUNTER — Inpatient Hospital Stay: Payer: BC Managed Care – PPO | Admitting: Oncology

## 2023-01-23 VITALS — BP 106/71 | HR 90 | Temp 98.1°F | Resp 18 | Ht 75.0 in | Wt 209.7 lb

## 2023-01-23 DIAGNOSIS — R768 Other specified abnormal immunological findings in serum: Secondary | ICD-10-CM | POA: Diagnosis not present

## 2023-01-23 DIAGNOSIS — D472 Monoclonal gammopathy: Secondary | ICD-10-CM | POA: Diagnosis not present

## 2023-01-23 DIAGNOSIS — G629 Polyneuropathy, unspecified: Secondary | ICD-10-CM | POA: Diagnosis not present

## 2023-01-23 NOTE — Progress Notes (Signed)
  [facility] OFFICE PROGRESS NOTE   Diagnosis: Serum monoclonal protein  INTERVAL HISTORY:   Ryan. Ryan Fox returns as scheduled.  He feels well.  Good appetite and energy level.  No fever or night sweats.  No palpable lymph nodes.  No joint swelling or pain.  Stable neuropathy symptoms in the feet.  Gabapentin helps.  Objective:  Vital signs in last 24 hours:  Blood pressure 106/71, pulse 90, temperature 98.1 F (36.7 C), temperature source Temporal, resp. rate 18, height 6\' 3"  (1.905 m), weight 209 lb 11.2 oz (95.1 kg), SpO2 99%.     Lymphatics: No cervical, supraclavicular, axillary, or inguinal nodes GI: No hepatosplenomegaly Vascular: No leg edema Neuro: Foot strength intact with dorsi flexion bilaterally   Lab Results:  Lab Results  Component Value Date   WBC 6.3 01/16/2023   HGB 16.7 01/16/2023   HCT 51.3 01/16/2023   MCV 91.6 01/16/2023   PLT 199 01/16/2023   NEUTROABS 4.3 01/16/2023    CMP  Lab Results  Component Value Date   NA 143 01/16/2023   K 4.4 01/16/2023   CL 107 01/16/2023   CO2 30 01/16/2023   GLUCOSE 83 01/16/2023   BUN 18 01/16/2023   CREATININE 1.08 01/16/2023   CALCIUM 9.9 01/16/2023   PROT 6.9 01/16/2023   ALBUMIN 4.4 01/16/2023   AST 13 (L) 01/16/2023   ALT 9 01/16/2023   ALKPHOS 58 01/16/2023   BILITOT 1.0 01/16/2023   GFRNONAA >60 01/16/2023     Medications: I have reviewed the patient's current medications.   Assessment/Plan: Serum monoclonal IgM lambda protein Serum M spike 0.3 g/dL 0/12/8117 Normal IgG and IgA levels 08/13/2022 L1 foci of signal abnormality on MRI lumbar spine 08/06/2022, indeterminate-increased from an MRI in November 2020 Spinal stenosis at L4-5 and left neuroforaminal stenosis on MRI 08/06/2022 Chronic foot numbness/tingling History of colon polyps     Disposition: Ryan Fox appears stable.  The serum M protein is unchanged.  There is no clinical evidence for development of a  lymphoproliferative disorder.  The CBC is normal.  The plan is to continue observation.  The mild elevation of the rheumatoid factor is most likely nonspecific.  He will call for development of joint symptoms.  He will return for an office and lab visit in 8 months.  Thornton Papas, MD  01/23/2023  8:46 AM

## 2023-04-15 DIAGNOSIS — J40 Bronchitis, not specified as acute or chronic: Secondary | ICD-10-CM | POA: Diagnosis not present

## 2023-04-15 DIAGNOSIS — R051 Acute cough: Secondary | ICD-10-CM | POA: Diagnosis not present

## 2023-04-15 DIAGNOSIS — J189 Pneumonia, unspecified organism: Secondary | ICD-10-CM | POA: Diagnosis not present

## 2023-05-13 DIAGNOSIS — J189 Pneumonia, unspecified organism: Secondary | ICD-10-CM | POA: Diagnosis not present

## 2023-05-14 DIAGNOSIS — H5212 Myopia, left eye: Secondary | ICD-10-CM | POA: Diagnosis not present

## 2023-05-14 DIAGNOSIS — H26491 Other secondary cataract, right eye: Secondary | ICD-10-CM | POA: Diagnosis not present

## 2023-05-14 DIAGNOSIS — H524 Presbyopia: Secondary | ICD-10-CM | POA: Diagnosis not present

## 2023-05-14 DIAGNOSIS — H2512 Age-related nuclear cataract, left eye: Secondary | ICD-10-CM | POA: Diagnosis not present

## 2023-05-14 DIAGNOSIS — H43812 Vitreous degeneration, left eye: Secondary | ICD-10-CM | POA: Diagnosis not present

## 2023-05-20 DIAGNOSIS — H26491 Other secondary cataract, right eye: Secondary | ICD-10-CM | POA: Diagnosis not present

## 2023-07-24 ENCOUNTER — Other Ambulatory Visit: Payer: BC Managed Care – PPO

## 2023-07-31 ENCOUNTER — Ambulatory Visit: Payer: BC Managed Care – PPO | Admitting: Oncology

## 2023-08-05 DIAGNOSIS — R7989 Other specified abnormal findings of blood chemistry: Secondary | ICD-10-CM | POA: Diagnosis not present

## 2023-08-05 DIAGNOSIS — Z1212 Encounter for screening for malignant neoplasm of rectum: Secondary | ICD-10-CM | POA: Diagnosis not present

## 2023-08-06 DIAGNOSIS — R739 Hyperglycemia, unspecified: Secondary | ICD-10-CM | POA: Diagnosis not present

## 2023-08-06 DIAGNOSIS — R972 Elevated prostate specific antigen [PSA]: Secondary | ICD-10-CM | POA: Diagnosis not present

## 2023-08-06 DIAGNOSIS — R7989 Other specified abnormal findings of blood chemistry: Secondary | ICD-10-CM | POA: Diagnosis not present

## 2023-08-11 DIAGNOSIS — L821 Other seborrheic keratosis: Secondary | ICD-10-CM | POA: Diagnosis not present

## 2023-08-11 DIAGNOSIS — L814 Other melanin hyperpigmentation: Secondary | ICD-10-CM | POA: Diagnosis not present

## 2023-08-11 DIAGNOSIS — L570 Actinic keratosis: Secondary | ICD-10-CM | POA: Diagnosis not present

## 2023-08-11 DIAGNOSIS — D235 Other benign neoplasm of skin of trunk: Secondary | ICD-10-CM | POA: Diagnosis not present

## 2023-08-12 DIAGNOSIS — Z0000 Encounter for general adult medical examination without abnormal findings: Secondary | ICD-10-CM | POA: Diagnosis not present

## 2023-08-12 DIAGNOSIS — Z1339 Encounter for screening examination for other mental health and behavioral disorders: Secondary | ICD-10-CM | POA: Diagnosis not present

## 2023-08-12 DIAGNOSIS — Z1331 Encounter for screening for depression: Secondary | ICD-10-CM | POA: Diagnosis not present

## 2023-08-12 DIAGNOSIS — D472 Monoclonal gammopathy: Secondary | ICD-10-CM | POA: Diagnosis not present

## 2023-08-12 DIAGNOSIS — R82998 Other abnormal findings in urine: Secondary | ICD-10-CM | POA: Diagnosis not present

## 2023-08-15 ENCOUNTER — Other Ambulatory Visit: Payer: Self-pay | Admitting: Sports Medicine

## 2023-08-15 DIAGNOSIS — R208 Other disturbances of skin sensation: Secondary | ICD-10-CM

## 2023-09-23 ENCOUNTER — Inpatient Hospital Stay: Payer: Self-pay | Attending: Oncology

## 2023-09-23 ENCOUNTER — Other Ambulatory Visit: Payer: BC Managed Care – PPO

## 2023-09-23 DIAGNOSIS — D472 Monoclonal gammopathy: Secondary | ICD-10-CM | POA: Diagnosis not present

## 2023-09-23 LAB — CMP (CANCER CENTER ONLY)
ALT: 8 U/L (ref 0–44)
AST: 14 U/L — ABNORMAL LOW (ref 15–41)
Albumin: 4.3 g/dL (ref 3.5–5.0)
Alkaline Phosphatase: 75 U/L (ref 38–126)
Anion gap: 10 (ref 5–15)
BUN: 19 mg/dL (ref 8–23)
CO2: 27 mmol/L (ref 22–32)
Calcium: 9.9 mg/dL (ref 8.9–10.3)
Chloride: 105 mmol/L (ref 98–111)
Creatinine: 1.07 mg/dL (ref 0.61–1.24)
GFR, Estimated: >60 mL/min (ref >60)
Glucose, Bld: 112 mg/dL — ABNORMAL HIGH (ref 70–99)
Potassium: 4.3 mmol/L (ref 3.5–5.1)
Sodium: 141 mmol/L (ref 135–145)
Total Bilirubin: 0.8 mg/dL (ref 0.0–1.2)
Total Protein: 6.8 g/dL (ref 6.5–8.1)

## 2023-09-23 LAB — CBC WITH DIFFERENTIAL (CANCER CENTER ONLY)
Abs Immature Granulocytes: 0 10*3/uL (ref 0.00–0.07)
Basophils Absolute: 0 10*3/uL (ref 0.0–0.1)
Basophils Relative: 1 %
Eosinophils Absolute: 0.3 10*3/uL (ref 0.0–0.5)
Eosinophils Relative: 5 %
HCT: 47.7 % (ref 39.0–52.0)
Hemoglobin: 15.9 g/dL (ref 13.0–17.0)
Immature Granulocytes: 0 %
Lymphocytes Relative: 21 %
Lymphs Abs: 1.1 10*3/uL (ref 0.7–4.0)
MCH: 30.5 pg (ref 26.0–34.0)
MCHC: 33.3 g/dL (ref 30.0–36.0)
MCV: 91.4 fL (ref 80.0–100.0)
Monocytes Absolute: 0.4 10*3/uL (ref 0.1–1.0)
Monocytes Relative: 8 %
Neutro Abs: 3.3 10*3/uL (ref 1.7–7.7)
Neutrophils Relative %: 65 %
Platelet Count: 185 10*3/uL (ref 150–400)
RBC: 5.22 MIL/uL (ref 4.22–5.81)
RDW: 12.6 % (ref 11.5–15.5)
WBC Count: 5.1 10*3/uL (ref 4.0–10.5)
nRBC: 0 % (ref 0.0–0.2)

## 2023-09-24 LAB — PROTEIN ELECTROPHORESIS, SERUM
A/G Ratio: 1.5 (ref 0.7–1.7)
Albumin ELP: 3.7 g/dL (ref 2.9–4.4)
Alpha-1-Globulin: 0.2 g/dL (ref 0.0–0.4)
Alpha-2-Globulin: 0.6 g/dL (ref 0.4–1.0)
Beta Globulin: 0.8 g/dL (ref 0.7–1.3)
Gamma Globulin: 0.8 g/dL (ref 0.4–1.8)
Globulin, Total: 2.4 g/dL (ref 2.2–3.9)
M-Spike, %: 0.2 g/dL — ABNORMAL HIGH
Total Protein ELP: 6.1 g/dL (ref 6.0–8.5)

## 2023-09-24 LAB — RHEUMATOID FACTOR: Rheumatoid fact SerPl-aCnc: 10.5 [IU]/mL (ref <14.0)

## 2023-09-24 LAB — IGM: IgM (Immunoglobulin M), Srm: 345 mg/dL — ABNORMAL HIGH (ref 20–172)

## 2023-09-24 LAB — KAPPA/LAMBDA LIGHT CHAINS
Kappa free light chain: 12.6 mg/L (ref 3.3–19.4)
Kappa, lambda light chain ratio: 0.99 (ref 0.26–1.65)
Lambda free light chains: 12.7 mg/L (ref 5.7–26.3)

## 2023-09-30 ENCOUNTER — Inpatient Hospital Stay (HOSPITAL_BASED_OUTPATIENT_CLINIC_OR_DEPARTMENT_OTHER): Payer: BC Managed Care – PPO | Admitting: Oncology

## 2023-09-30 VITALS — BP 103/64 | HR 94 | Temp 98.1°F | Resp 20 | Ht 75.0 in | Wt 203.1 lb

## 2023-09-30 DIAGNOSIS — D472 Monoclonal gammopathy: Secondary | ICD-10-CM

## 2023-09-30 NOTE — Progress Notes (Signed)
   [facility]ESS [facility]   Diagnosis: Serum monoclonal protein  INTERVAL HISTORY:   [name] returns as scheduled.  He feels well.  No fever, night sweats, or recent infection.  Good appetite and energy level.  He has neuropathy symptoms in the feet secondary to spinal stenosis.  The symptoms are improved with gabapentin .  Objective:  Vital signs in last 24 hours:  Blood pressure 103/64, pulse 94, temperature 98.1 F (36.7 C), temperature source Temporal, resp. rate 20, height 6' 3 (1.905 m), weight 203 lb 1.6 oz (92.1 kg), SpO2 100%.     Lymphatics: No cervical, supraclavicular, axillary, or inguinal nodes Resp: Lungs clear bilaterally Cardio: Regular rate and rhythm GI: No hepatosplenomegaly, nontender, no mass Vascular: No leg edema   Lab Results:  Lab Results  Component Value Date   WBC 5.1 09/23/2023   HGB 15.9 09/23/2023   HCT 47.7 09/23/2023   MCV 91.4 09/23/2023   PLT 185 09/23/2023   NEUTROABS 3.3 09/23/2023    CMP  Lab Results  Component Value Date   NA 141 09/23/2023   K 4.3 09/23/2023   CL 105 09/23/2023   CO2 27 09/23/2023   GLUCOSE 112 (H) 09/23/2023   BUN 19 09/23/2023   CREATININE 1.07 09/23/2023   CALCIUM 9.9 09/23/2023   PROT 6.8 09/23/2023   ALBUMIN 4.3 09/23/2023   AST 14 (L) 09/23/2023   ALT 8 09/23/2023   ALKPHOS 75 09/23/2023   BILITOT 0.8 09/23/2023   GFRNONAA >60 09/23/2023    Medications: I have reviewed the patient's current medications.   Assessment/Plan: Serum monoclonal IgM lambda protein Serum M spike 0.3 g/dL 07/08/7973 Normal IgG and IgA levels 08/13/2022 L1 foci of signal abnormality on MRI lumbar spine 08/06/2022, indeterminate-increased from an MRI in November 2020 Spinal stenosis at L4-5 and left neuroforaminal stenosis on MRI 08/06/2022 Chronic foot numbness/tingling History of colon polyps     Disposition: Mr Ryan Fox has a serum monoclonal IgM protein.  He is stable from a hematologic  standpoint and the serum M spike is unchanged.  There is no clinical evidence for progression to multiple myeloma, lymphoplasmacytic lymphoma, or another lymphoproliferative disorder.  The plan is to continue observation.  He will return for an office and lab visit in 9 months.  He will remain up-to-date on influenza and pneumonia vaccines.  Ryan Hof, MD  09/30/2023  8:10 AM

## 2023-11-28 ENCOUNTER — Ambulatory Visit (INDEPENDENT_AMBULATORY_CARE_PROVIDER_SITE_OTHER): Admitting: Pain Medicine

## 2023-11-28 VITALS — BP 124/68 | Ht 75.0 in | Wt 200.0 lb

## 2023-11-28 DIAGNOSIS — M545 Low back pain, unspecified: Secondary | ICD-10-CM | POA: Diagnosis not present

## 2023-11-28 MED ORDER — PREDNISONE 20 MG PO TABS: 20.0000 mg | ORAL_TABLET | Freq: Two times a day (BID) | ORAL | 0 refills | Status: DC

## 2023-11-28 MED ORDER — CYCLOBENZAPRINE HCL 5 MG PO TABS: 5.0000 mg | ORAL_TABLET | Freq: Three times a day (TID) | ORAL | 0 refills | Status: AC | PRN

## 2023-12-02 NOTE — Progress Notes (Signed)
 DATE OF VISIT: 11/28/2023        Ryan Fox DOB: 1956-04-01 MRN: [id number]  CC:  low back pain  History- Ryan Fox is a 68 y.o. male in clinic for evaluation and treatment of acute low back pain.  Pain complaint: Location: Low back bilaterally Duration: acute worsening this morning after leaning forward Quality: sharp with movement, grabbing Severity: absent in certain positions, but severe when moving Timing: with movement Radiation: denies Aggravating factors: movement Alleviating factors: sitting, leaning laterally Attribution: spinal stenosis  Denies bowel/bladder issues and weakness.   Past Medical History Past Medical History:  Diagnosis Date   Spinal stenosis    Spinal stenosis, lumbar region, without neurogenic claudication 06/04/2016   XRay 06/04/2016 is normal    Past Surgical History Past Surgical History:  Procedure Laterality Date   BASAL CELL CARCINOMA EXCISION     04/2021 scalp   OTHER SURGICAL HISTORY     retina membrane peel 03-21-2021 Dr. Elner    Medications Current Outpatient Medications  Medication Sig Dispense Refill   cyclobenzaprine  (FLEXERIL ) 5 MG tablet Take 1 tablet (5 mg total) by mouth 3 (three) times daily as needed for muscle spasms. 15 tablet 0   predniSONE  (DELTASONE ) 20 MG tablet Take 1 tablet (20 mg total) by mouth 2 (two) times daily with a meal. 14 tablet 0   gabapentin  (NEURONTIN ) 300 MG capsule TAKE 3 CAPSULES(900 MG) BY MOUTH THREE TIMES DAILY (Patient taking differently: Take 600 mg by mouth 2 (two) times daily. TAKE 3 CAPSULES(900 MG) BY MOUTH THREE TIMES DAILY) 270 capsule 1   triamcinolone cream (KENALOG) 0.1 % Apply 1 Application topically daily as needed. Chest rash     No current facility-administered medications for this visit.    Allergies has no known allergies.  Family History - reviewed per EMR and intake form  Social History   reports current alcohol  use of about 1.0 standard drink of alcohol  per week.   reports that he has never smoked. He has never used smokeless tobacco.  reports that he does not currently use drugs.  EXAM: Vitals: BP 124/68   Ht 6' 3 (1.905 m)   Wt 200 lb (90.7 kg)   BMI 25.00 kg/m  General: AOx3, pleasant SKIN: no rashes or lesions, skin clean, dry, intact MSK: Minimal TTP over SP of lower lumbar spine No TTP over lumbar facets   IMAGING: MRI Lspine 2024: IMPRESSION: 1. Progressive, moderate spinal and left neural foraminal stenosis at L4-5. 2. Mild-to-moderate lateral recess stenosis at L3-4, stable to mildly progressed. 3. Mildly increased size of 2 small foci of signal abnormality in the L1 vertebra, indeterminate. Consider evaluation for multiple myeloma.    ASSESSMENT:   ICD-10-CM   1. Acute midline low back pain without sciatica  M54.50        PLAN: 1. Acute low back pain - favor discogenic in origin given true midline pain with SP tenderness and acute worsening with leaning forward.  Has had similar issue before improved with 1 week course of prednisone .  No red flag symptoms.   - Prednisone  20mg  PO BID x 1 week.  Explained steroid side effects to patient. - Ibuprofen OTC 800mg  PO TID x 5 days for analgesia and anti-inflammatory effects. - Flexeril  5mg  PO TID PRN x 5 days ordered as well.  Instructed patient to take if needed for secondary muscle spasms.  The patient will return to see me as needed, will call sooner with any questions/concerns.   Patient expressed understanding &  agreement with above.

## 2023-12-18 ENCOUNTER — Ambulatory Visit: Admitting: Sports Medicine

## 2023-12-18 ENCOUNTER — Ambulatory Visit (INDEPENDENT_AMBULATORY_CARE_PROVIDER_SITE_OTHER): Admitting: Sports Medicine

## 2023-12-18 VITALS — BP 127/68 | Ht 75.0 in | Wt 200.0 lb

## 2023-12-18 DIAGNOSIS — M48061 Spinal stenosis, lumbar region without neurogenic claudication: Secondary | ICD-10-CM | POA: Diagnosis not present

## 2023-12-18 MED ORDER — PREDNISONE 20 MG PO TABS: 20.0000 mg | ORAL_TABLET | Freq: Two times a day (BID) | ORAL | 0 refills | Status: AC

## 2023-12-18 MED ORDER — MELOXICAM 15 MG PO TABS: ORAL_TABLET | ORAL | 2 refills | Status: DC

## 2023-12-18 NOTE — Progress Notes (Signed)
 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  Over the past 5 years his low back pain has been recurring with more frequency and can be quite severe.  He had MRIs in 2020 and 2024 both which showed some degenerative change in the lower lumbar spine with some definite neural tightening and impingement that might explain his burning foot symptoms that we treated him for in the past.  However the most likely pain generator was thought to be his facet joints based on his MRI findings and on his evaluation with neurosurgery and physical therapy.  Gabapentin  is taken away mostly burning in his feet and he takes 600 twice daily  On several occasions he has had a bad enough flare that the only comfort he can find was to walk on crutches.  On each of these occasions he was considerably improved even within 1 to 2 days of prednisone  He has diligently followed a home exercise program given at PT  He notes that walking even longer walks actually improve his back symptoms.  He tends to get flares when he plays tennis 2 days in a row.  Physical examination Patient appears to be in mild discomfort but no severe pain BP 127/68   Ht 6' 3 (1.905 m)   Wt 200 lb (90.7 kg)   BMI 25.00 kg/m   Today he can flex his lumbar spine about 30 degrees but is hesitant to go further as he already feels some symptoms He can extend his back 20 degrees Lateral bending is not that painful Rotation is not that painful He has the strength to walk on his heels toes and to do crossover walks without difficulty No neurologic deficits noted

## 2023-12-18 NOTE — Assessment & Plan Note (Signed)
 While it appears gabapentin  has resolved some of the referred radicular symptoms to the bottom of both feet he continues to have flares of sharp back pain that radiate to the midline but not down into the legs or hips He has been advised that this was probably facet joint disease giving him most of his symptoms and they could be a candidate for a directed facet joint injection particularly since he responded so well to oral prednisone   However his exam does not seem completely consistent with this and I think we should try a course of 6 weeks of walking as his primary back rehabilitation exercise Avoiding tennis during this time Continue gabapentin  at the same dose Adding meloxicam  15 mg a day He has a good home exercise program and should do that daily  I want to see him in 6 weeks and reassess.  If he does not make significant progress we may need to consider more interventions including facet joint injections.  For the long-term I think he needs to be involved in Pilates or another exercise regimen that seems to prevent him from having flares of his back pain  He will return to see me in 6 weeks

## 2023-12-23 ENCOUNTER — Other Ambulatory Visit: Payer: Self-pay | Admitting: Sports Medicine

## 2023-12-23 DIAGNOSIS — R208 Other disturbances of skin sensation: Secondary | ICD-10-CM

## 2024-01-09 ENCOUNTER — Ambulatory Visit

## 2024-01-09 DIAGNOSIS — Z23 Encounter for immunization: Secondary | ICD-10-CM

## 2024-01-12 DIAGNOSIS — H11432 Conjunctival hyperemia, left eye: Secondary | ICD-10-CM | POA: Diagnosis not present

## 2024-02-10 ENCOUNTER — Ambulatory Visit (INDEPENDENT_AMBULATORY_CARE_PROVIDER_SITE_OTHER): Admitting: Sports Medicine

## 2024-02-10 VITALS — BP 122/72 | Ht 75.0 in | Wt 205.0 lb

## 2024-02-10 DIAGNOSIS — M48061 Spinal stenosis, lumbar region without neurogenic claudication: Secondary | ICD-10-CM | POA: Diagnosis not present

## 2024-02-10 NOTE — Assessment & Plan Note (Addendum)
 Patient seems to have an excellent response to a consistent walking program and easy back motion exercises We will continue this He is not had any severe flares in the last 6 weeks and has been able to resume playing tennis  Our plan is to maintain him on this program but if he starts having back pain again he may want to consider Pilates or other consistent activity to help prevent future flares of low back pain  I suspect his hip abduction weakness may be related to the low back neurologic impingement He was given hip abduction exercise series to do consistently and I will reevaluate his strength in the future

## 2024-02-10 NOTE — Progress Notes (Signed)
 Chief complaint follow-up of severe low back pain  Patient has had low back pain from the time he was in college He has had MRI and neurosurgical evaluation And a combination of issues including some degenerative change in the discs along with neuroforaminal stenosis that causes radicular symptoms that lead to burning foot syndrome Facet joint arthritic changes  In the past he has severe flares have responded very well to prednisone  as they did with his last severe flare  Standard physical therapy has not helped that much so on his last visit we decided to start him on a program to see if consistent walking and easy motion would lessen his symptoms Over the past 6 weeks he has been walking 30 minutes in the morning and 30 minutes in the evening consistently He does some easy back motion exercises and some stretching  He states that virtually all his pain is resolved and he was able to resume playing tennis over the last 2 weeks  Physical exam Tall athletic white male in no acute distress BP 122/72   Ht 6' 3 (1.905 m)   Wt 205 lb (93 kg)   BMI 25.62 kg/m   Back extension to 30 degrees causes no pain Forward flexion cautiously shows full range of motion without pain Lateral bending and rotation without pain  He shows the ability to walk on heels toes with no signs of weakness Strength testing gluteus max quadriceps and hip flexors are all normal  He does show bilateral hip abduction weakness that is significant

## 2024-02-23 DIAGNOSIS — K4090 Unilateral inguinal hernia, without obstruction or gangrene, not specified as recurrent: Secondary | ICD-10-CM | POA: Diagnosis not present

## 2024-02-26 DIAGNOSIS — K4090 Unilateral inguinal hernia, without obstruction or gangrene, not specified as recurrent: Secondary | ICD-10-CM | POA: Diagnosis not present

## 2024-05-06 ENCOUNTER — Ambulatory Visit: Payer: Self-pay | Admitting: Surgery

## 2024-05-06 ENCOUNTER — Encounter (HOSPITAL_COMMUNITY): Payer: Self-pay | Admitting: Surgery

## 2024-05-06 ENCOUNTER — Other Ambulatory Visit: Payer: Self-pay

## 2024-05-06 NOTE — Progress Notes (Signed)
 SDW CALL  Patient was given pre-op instructions over the phone. The opportunity was given for the patient to ask questions. No further questions asked. Patient verbalized understanding of instructions given.   PCP - Dr. Norleen Jungling Cardiologist - denies  PPM/ICD - denies Device Orders - n/a Rep Notified -  n/a  Chest x-ray - denies EKG - denies Stress Test - denies ECHO - denies Cardiac Cath - denies  Sleep Study - home sleep teste 07/23/21 - borderline OSA - patient denies sleep apnea CPAP - n/a  No DM  Last dose of GLP1 agonist-  n/a GLP1 instructions:  n/a  Blood Thinner Instructions:  n/a Aspirin Instructions: n/a  ERAS Protcol - clears until 1030 PRE-SURGERY Ensure or G2- n/a  COVID TEST- n/a   Anesthesia review: no  Patient denies shortness of breath, fever, cough and chest pain over the phone call   All instructions explained to the patient, with a verbal understanding of the material. Patient agrees to go over the instructions while at home for a better understanding.   Patient stated that he will not arrive until 1130 because that is what his paperwork stated.

## 2024-05-07 ENCOUNTER — Other Ambulatory Visit: Payer: Self-pay

## 2024-05-07 ENCOUNTER — Ambulatory Visit (HOSPITAL_COMMUNITY)
Admission: RE | Admit: 2024-05-07 | Discharge: 2024-05-07 | Disposition: A | Discharge status: Home / Self Care | Attending: Surgery | Admitting: Surgery

## 2024-05-07 ENCOUNTER — Ambulatory Visit (HOSPITAL_COMMUNITY)

## 2024-05-07 ENCOUNTER — Encounter (HOSPITAL_COMMUNITY)
Admission: RE | Disposition: A | Discharge status: Home / Self Care | Payer: Self-pay | Source: Home / Self Care | Attending: Surgery

## 2024-05-07 ENCOUNTER — Encounter (HOSPITAL_COMMUNITY): Payer: Self-pay | Admitting: Surgery

## 2024-05-07 DIAGNOSIS — N400 Enlarged prostate without lower urinary tract symptoms: Secondary | ICD-10-CM | POA: Insufficient documentation

## 2024-05-07 DIAGNOSIS — K4090 Unilateral inguinal hernia, without obstruction or gangrene, not specified as recurrent: Secondary | ICD-10-CM | POA: Diagnosis present

## 2024-05-07 DIAGNOSIS — Z79899 Other long term (current) drug therapy: Secondary | ICD-10-CM | POA: Insufficient documentation

## 2024-05-07 HISTORY — DX: Nocturia: R35.1

## 2024-05-07 HISTORY — DX: Sleep apnea, unspecified: G47.30

## 2024-05-07 HISTORY — DX: Monoclonal gammopathy: D47.2

## 2024-05-07 LAB — CBC
HCT: 45.7 % (ref 39.0–52.0)
Hemoglobin: 15.5 g/dL (ref 13.0–17.0)
MCH: 30.7 pg (ref 26.0–34.0)
MCHC: 33.9 g/dL (ref 30.0–36.0)
MCV: 90.5 fL (ref 80.0–100.0)
Platelets: 186 10*3/uL (ref 150–400)
RBC: 5.05 MIL/uL (ref 4.22–5.81)
RDW: 13.2 % (ref 11.5–15.5)
WBC: 6 10*3/uL (ref 4.0–10.5)
nRBC: 0 % (ref 0.0–0.2)

## 2024-05-07 MED ORDER — FENTANYL CITRATE (PF) 100 MCG/2ML IJ SOLN
INTRAMUSCULAR | Status: AC
  Filled 2024-05-07: qty 2

## 2024-05-07 MED ORDER — FENTANYL CITRATE (PF) 250 MCG/5ML IJ SOLN
INTRAMUSCULAR | Status: DC | PRN
  Administered 2024-05-07: 100 ug via INTRAVENOUS
  Administered 2024-05-07 (×2): 25 ug via INTRAVENOUS

## 2024-05-07 MED ORDER — LIDOCAINE 2% (20 MG/ML) 5 ML SYRINGE
INTRAMUSCULAR | Status: DC | PRN
  Administered 2024-05-07: 100 mg via INTRAVENOUS

## 2024-05-07 MED ORDER — ALBUMIN HUMAN 5 % IV SOLN: INTRAVENOUS | Status: DC | PRN

## 2024-05-07 MED ORDER — OXYCODONE HCL 5 MG PO TABS: 5.0000 mg | ORAL_TABLET | ORAL | 0 refills | Status: AC | PRN

## 2024-05-07 MED ORDER — PHENYLEPHRINE HCL (PRESSORS) 10 MG/ML IV SOLN
INTRAVENOUS | Status: DC | PRN
  Administered 2024-05-07: 40 ug via INTRAVENOUS

## 2024-05-07 MED ORDER — ROCURONIUM BROMIDE 10 MG/ML (PF) SYRINGE
PREFILLED_SYRINGE | INTRAVENOUS | Status: DC | PRN
  Administered 2024-05-07: 60 mg via INTRAVENOUS

## 2024-05-07 MED ORDER — CHLORHEXIDINE GLUCONATE 0.12 % MT SOLN
15.0000 mL | Freq: Once | OROMUCOSAL | Status: AC
  Administered 2024-05-07: 15 mL via OROMUCOSAL
  Filled 2024-05-07: qty 15

## 2024-05-07 MED ORDER — KETOROLAC TROMETHAMINE 30 MG/ML IJ SOLN
INTRAMUSCULAR | Status: DC | PRN
  Administered 2024-05-07: 15 mg via INTRAVENOUS

## 2024-05-07 MED ORDER — SUGAMMADEX SODIUM 200 MG/2ML IV SOLN
INTRAVENOUS | Status: DC | PRN
  Administered 2024-05-07: 200 mg via INTRAVENOUS

## 2024-05-07 MED ORDER — PROPOFOL 10 MG/ML IV BOLUS
INTRAVENOUS | Status: DC | PRN
  Administered 2024-05-07: 150 mg via INTRAVENOUS
  Administered 2024-05-07: 20 mg via INTRAVENOUS
  Administered 2024-05-07: 50 mg via INTRAVENOUS

## 2024-05-07 MED ORDER — 0.9 % SODIUM CHLORIDE (POUR BTL) OPTIME
TOPICAL | Status: DC | PRN
  Administered 2024-05-07: 1000 mL

## 2024-05-07 MED ORDER — PROPOFOL 10 MG/ML IV BOLUS
INTRAVENOUS | Status: AC
  Filled 2024-05-07: qty 20

## 2024-05-07 MED ORDER — DEXAMETHASONE SOD PHOSPHATE PF 10 MG/ML IJ SOLN
INTRAMUSCULAR | Status: DC | PRN
  Administered 2024-05-07: 10 mg via INTRAVENOUS

## 2024-05-07 MED ORDER — OXYCODONE HCL 5 MG/5ML PO SOLN: 5.0000 mg | Freq: Once | ORAL | Status: DC | PRN

## 2024-05-07 MED ORDER — ACETAMINOPHEN 500 MG PO TABS
1000.0000 mg | ORAL_TABLET | ORAL | Status: AC
  Administered 2024-05-07: 1000 mg via ORAL
  Filled 2024-05-07: qty 2

## 2024-05-07 MED ORDER — CHLORHEXIDINE GLUCONATE CLOTH 2 % EX PADS: 6.0000 | MEDICATED_PAD | Freq: Once | CUTANEOUS | Status: DC

## 2024-05-07 MED ORDER — IBUPROFEN 600 MG PO TABS: 600.0000 mg | ORAL_TABLET | Freq: Four times a day (QID) | ORAL | 1 refills | Status: AC | PRN

## 2024-05-07 MED ORDER — OXYCODONE HCL 5 MG PO TABS: 5.0000 mg | ORAL_TABLET | Freq: Once | ORAL | Status: DC | PRN

## 2024-05-07 MED ORDER — FENTANYL CITRATE (PF) 100 MCG/2ML IJ SOLN: 25.0000 ug | INTRAMUSCULAR | Status: DC | PRN

## 2024-05-07 MED ORDER — METHOCARBAMOL 750 MG PO TABS: 750.0000 mg | ORAL_TABLET | Freq: Four times a day (QID) | ORAL | 1 refills | Status: AC

## 2024-05-07 MED ORDER — DROPERIDOL 2.5 MG/ML IJ SOLN: 0.6250 mg | Freq: Once | INTRAMUSCULAR | Status: DC | PRN

## 2024-05-07 MED ORDER — ACETAMINOPHEN 10 MG/ML IV SOLN: 1000.0000 mg | Freq: Once | INTRAVENOUS | Status: DC | PRN

## 2024-05-07 MED ORDER — ACETAMINOPHEN 500 MG PO TABS: 1000.0000 mg | ORAL_TABLET | Freq: Four times a day (QID) | ORAL | 3 refills | Status: AC

## 2024-05-07 MED ORDER — PHENYLEPHRINE 80 MCG/ML (10ML) SYRINGE FOR IV PUSH (FOR BLOOD PRESSURE SUPPORT)
PREFILLED_SYRINGE | INTRAVENOUS | Status: DC | PRN
  Administered 2024-05-07: 80 ug via INTRAVENOUS

## 2024-05-07 MED ORDER — ORAL CARE MOUTH RINSE: 15.0000 mL | Freq: Once | OROMUCOSAL | Status: AC

## 2024-05-07 MED ORDER — POLYETHYLENE GLYCOL 3350 17 G PO PACK: 17.0000 g | PACK | Freq: Every day | ORAL | 1 refills | Status: AC

## 2024-05-07 MED ORDER — ONDANSETRON HCL 4 MG/2ML IJ SOLN
INTRAMUSCULAR | Status: DC | PRN
  Administered 2024-05-07: 4 mg via INTRAVENOUS

## 2024-05-07 MED ORDER — MIDAZOLAM HCL 2 MG/2ML IJ SOLN
INTRAMUSCULAR | Status: AC
  Filled 2024-05-07: qty 2

## 2024-05-07 MED ORDER — ENOXAPARIN SODIUM 40 MG/0.4ML IJ SOSY
40.0000 mg | PREFILLED_SYRINGE | Freq: Once | INTRAMUSCULAR | Status: AC
  Administered 2024-05-07: 40 mg via SUBCUTANEOUS
  Filled 2024-05-07: qty 0.4

## 2024-05-07 MED ORDER — LACTATED RINGERS IV SOLN: INTRAVENOUS | Status: DC

## 2024-05-07 MED ORDER — BUPIVACAINE HCL (PF) 0.25 % IJ SOLN
INTRAMUSCULAR | Status: AC
  Filled 2024-05-07: qty 30

## 2024-05-07 MED ORDER — MIDAZOLAM HCL (PF) 2 MG/2ML IJ SOLN
INTRAMUSCULAR | Status: DC | PRN
  Administered 2024-05-07: 2 mg via INTRAVENOUS

## 2024-05-07 MED ORDER — CEFAZOLIN SODIUM-DEXTROSE 2-4 GM/100ML-% IV SOLN
2.0000 g | INTRAVENOUS | Status: AC
  Administered 2024-05-07: 2 g via INTRAVENOUS
  Filled 2024-05-07: qty 100

## 2024-05-07 MED ORDER — BUPIVACAINE HCL (PF) 0.25 % IJ SOLN
INTRAMUSCULAR | Status: DC | PRN
  Administered 2024-05-07: 30 mL

## 2024-05-07 NOTE — Transfer of Care (Signed)
 Immediate Anesthesia Transfer of Care Note  Patient: Ryan Fox  Procedure(s) Performed: OPEN REPAIR, HERNIA, INGUINAL, ADULT (Right: Groin) INSERTION OF MESH (Right: Groin)  Patient Location: PACU  Anesthesia Type:General  Level of Consciousness: drowsy and patient cooperative  Airway & Oxygen Therapy: Patient Spontanous Breathing  Post-op Assessment: Report given to RN and Post -op Vital signs reviewed and stable  Post vital signs: Reviewed and stable  Last Vitals:  Vitals Value Taken Time  BP 128/92 05/07/24 14:37  Temp    Pulse 70 05/07/24 14:39  Resp 14 05/07/24 14:39  SpO2 95 % 05/07/24 14:39  Vitals shown include unfiled device data.  Last Pain:  Vitals:   05/07/24 1202  TempSrc:   PainSc: 0-No pain      Patients Stated Pain Goal: 0 (05/07/24 1155)  Complications: No notable events documented.

## 2024-05-07 NOTE — H&P (Signed)
" ° ° °  Ryan Fox is an 69 y.o. male.   HPI: 69M with RIH. Plan oRIHR with mesh. The patient has had no hospitalizations, ER visits, surgeries, or newly diagnosed allergies since being seen in the office. Seen by Dr. Shane for enlarged prostate and was started on flomax.   Past Medical History:  Diagnosis Date   MGUS (monoclonal gammopathy of unknown significance)    Nocturia    Sleep apnea    patient denies - stated it was mild   Spinal stenosis    Spinal stenosis, lumbar region, without neurogenic claudication 06/04/2016   XRay 06/04/2016 is normal    Past Surgical History:  Procedure Laterality Date   BASAL CELL CARCINOMA EXCISION     04/2021 scalp   CATARACT EXTRACTION Right    COLONOSCOPY     KNEE ARTHROSCOPY     right and left (1981 and 2003)   OTHER SURGICAL HISTORY     retina membrane peel 03-21-2021 Dr. Elner FIX SALIVARY CYST EXCISION      Family History  Problem Relation Age of Onset   Glaucoma Mother    Macular degeneration Mother    Glaucoma Maternal Grandfather    Brain cancer Maternal Grandfather     Social History:  reports that he has never smoked. He has never used smokeless tobacco. He reports that he does not currently use alcohol  after a past usage of about 1.0 standard drink of alcohol  per week. He reports that he does not currently use drugs.  Allergies: Allergies[1]  Medications: I have reviewed the patient's current medications.  No results found for this or any previous visit (from the past 48 hours).  No results found.  ROS 10 point review of systems is negative except as listed above in HPI.   Physical Exam Blood pressure 134/89, pulse (!) 58, temperature 97.9 F (36.6 C), temperature source Oral, resp. rate 19, height 6' 3 (1.905 m), weight 93 kg, SpO2 98%. Constitutional: well-developed, well-nourished HEENT: pupils equal, round, reactive to light, 2mm b/l, moist conjunctiva, external inspection of ears and nose normal,  hearing intact Oropharynx: normal oropharyngeal mucosa, normal dentition Neck: no thyromegaly, trachea midline, no midline cervical tenderness to palpation Chest: breath sounds equal bilaterally, normal respiratory effort, no midline or lateral chest wall tenderness to palpation/deformity Abdomen: soft, NT, no bruising, no hepatosplenomegaly GU: no blood at urethral meatus of penis, no scrotal masses or abnormality  Skin: warm, dry, no rashes Psych: normal memory, normal mood/affect     Assessment/Plan: RIH - plan oRIHR with mesh. Informed consent was obtained after detailed explanation of risks, including bleeding, infection, hematoma/seroma, decreased fertility, temporary or permanent neuropathy, hernia recurrence, and mesh infection requiring explantation. All questions answered to the patient's satisfaction. FEN - NPO except sips/chips DVT - SCDs Dispo - home post-op    Dreama GEANNIE Hanger, MD General and Trauma Surgery [facility]     [1] No Known Allergies  "

## 2024-05-07 NOTE — Op Note (Signed)
" ° °  Operative Note   Date: 05/07/2024  Procedure: right inguinal hernia repair  Pre-op diagnosis: right inguinal hernia Post-op diagnosis: right direct inguinal hernia  Indication and clinical history: The patient is a 69 y.o. year old male with a right inguinal hernia.  Surgeon: Dreama GEANNIE Hanger, MD  Anesthesiologist: Erma, MD Anesthesia: General  Findings:  Specimen: none EBL: <5cc Drains/Implants: 3x6 ultrapro mesh  Disposition: PACU - hemodynamically stable.  Description of procedure: The patient was positioned supine on the operating room table. General anesthetic induction and intubation were uneventful. Foley catheter insertion was performed and was atraumatic. Time-out was performed verifying correct patient, procedure, laterality, and signature of informed consent. The right groin was prepped and draped in the usual sterile fashion.  An incision was made approximately 2/3 distance from the ASIS to the pubic tubercle and deepened through Scarpa's fascia until the external oblique aponeurosis was reached. The external oblique aponeurosis was entered sharply and opened using Metzenbaum scissors, so as to avoid injuring the ilioinguinal nerve. The spermatic cord was isolated and encircled with a Penrose drain. A direct hernia was clearly identified and hernia contents reduced.  A 3x6 ultrapro mesh was cut to fit and sutured to the pubic tubercle with a zero vicryl suture and run along the shelving edge inferiorly and the conjoined tendon superiorly. A slit was made in the center of the mesh to accommodate the cord structures and a vicryl suture was used to create a snug fit around it. The ends of the mesh were overlapped beneath the external oblique aponeurosis. The external oblique was closed with a 2-0 vicryl suture and Scarpa's closed with a 3-0 vicryl. Local anesthetic was infiltrated into the surrounding tissues. The skin was closed with 4-0 monocryl suture and dermabond applied  as dressing.   All sponge and instrument counts were correct at the conclusion of the procedure. The patient was awakened from anesthesia, extubated uneventfully, and transported to PACU - hemodynamically stable.. There were no complications.    Dreama GEANNIE Hanger, MD General and Trauma Surgery [facility]   "

## 2024-05-07 NOTE — Anesthesia Procedure Notes (Signed)
 Procedure Name: Intubation Date/Time: 05/07/2024 1:19 PM  Performed by: Erlene Powell POUR, CRNAPre-anesthesia Checklist: Patient identified, Emergency Drugs available, Suction available and Patient being monitored Patient Re-evaluated:Patient Re-evaluated prior to induction Oxygen Delivery Method: Circle system utilized Preoxygenation: Pre-oxygenation with 100% oxygen Induction Type: IV induction Ventilation: Mask ventilation without difficulty Laryngoscope Size: Miller and 2 Grade View: Grade I Tube type: Oral Tube size: 7.5 mm Number of attempts: 1 Airway Equipment and Method: Stylet Placement Confirmation: ETT inserted through vocal cords under direct vision, positive ETCO2 and breath sounds checked- equal and bilateral Secured at: 22 cm Tube secured with: Tape Dental Injury: Teeth and Oropharynx as per pre-operative assessment

## 2024-05-07 NOTE — Anesthesia Preprocedure Evaluation (Addendum)
"                                    Anesthesia Evaluation  Patient identified by MRN, date of birth, ID band Patient awake    Reviewed: Allergy & Precautions, H&P , NPO status , Patient's Chart, lab work & pertinent test results  History of Anesthesia Complications Negative for: history of anesthetic complications  Airway Mallampati: II  TM Distance: >3 FB Neck ROM: Full    Dental no notable dental hx.    Pulmonary sleep apnea    Pulmonary exam normal breath sounds clear to auscultation       Cardiovascular (-) angina (-) Past MI negative cardio ROS Normal cardiovascular exam Rhythm:Regular Rate:Normal     Neuro/Psych neg Seizures Spinal stenosis   negative psych ROS   GI/Hepatic Neg liver ROS,,,Right inguinal hernia   Endo/Other  negative endocrine ROS    Renal/GU negative Renal ROS  negative genitourinary   Musculoskeletal negative musculoskeletal ROS (+)    Abdominal   Peds negative pediatric ROS (+)  Hematology mgus   Anesthesia Other Findings   Reproductive/Obstetrics negative OB ROS                              Anesthesia Physical Anesthesia Plan  ASA: 2  Anesthesia Plan: General   Post-op Pain Management:    Induction: Intravenous  PONV Risk Score and Plan: 2 and Ondansetron , Dexamethasone  and Treatment may vary due to age or medical condition  Airway Management Planned: Oral ETT  Additional Equipment: None  Intra-op Plan:   Post-operative Plan: Extubation in OR  Informed Consent: I have reviewed the patients History and Physical, chart, labs and discussed the procedure including the risks, benefits and alternatives for the proposed anesthesia with the patient or authorized representative who has indicated his/her understanding and acceptance.     Dental advisory given  Plan Discussed with: CRNA  Anesthesia Plan Comments:          Anesthesia Quick Evaluation  "

## 2024-05-08 NOTE — Anesthesia Postprocedure Evaluation (Signed)
"   Anesthesia Post Note  Patient: Ryan Fox  Procedure(s) Performed: OPEN REPAIR, HERNIA, INGUINAL, ADULT (Right: Groin) INSERTION OF MESH (Right: Groin)     Patient location during evaluation: PACU Anesthesia Type: General Level of consciousness: awake and alert Pain management: pain level controlled Vital Signs Assessment: post-procedure vital signs reviewed and stable Respiratory status: spontaneous breathing, nonlabored ventilation, respiratory function stable and patient connected to nasal cannula oxygen Cardiovascular status: blood pressure returned to baseline and stable Postop Assessment: no apparent nausea or vomiting Anesthetic complications: no   No notable events documented.  Last Vitals:  Vitals:   05/07/24 1445 05/07/24 1500  BP: 120/86 132/82  Pulse: 67 (!) 59  Resp: 12 13  Temp:  36.4 C  SpO2: 97% 94%    Last Pain:  Vitals:   05/07/24 1500  TempSrc:   PainSc: 0-No pain                 Thom JONELLE Peoples      "

## 2024-05-10 ENCOUNTER — Encounter (HOSPITAL_COMMUNITY): Payer: Self-pay | Admitting: Surgery

## 2024-06-22 ENCOUNTER — Other Ambulatory Visit

## 2024-06-29 ENCOUNTER — Ambulatory Visit: Admitting: Oncology

## 2024-06-29 ENCOUNTER — Other Ambulatory Visit
